# Patient Record
Sex: Male | Born: 1965 | Race: White | Hispanic: No | Marital: Married | Smoking: Former smoker
Health system: Southern US, Community
[De-identification: ages and names within clinical notes are randomized; demographics above are authoritative.]

## PROBLEM LIST (undated history)

## (undated) DIAGNOSIS — C61 Malignant neoplasm of prostate: Principal | ICD-10-CM

## (undated) DIAGNOSIS — R972 Elevated prostate specific antigen [PSA]: Secondary | ICD-10-CM

## (undated) DIAGNOSIS — N390 Urinary tract infection, site not specified: Secondary | ICD-10-CM

## (undated) DIAGNOSIS — I25110 Atherosclerotic heart disease of native coronary artery with unstable angina pectoris: Principal | ICD-10-CM

## (undated) DIAGNOSIS — N401 Enlarged prostate with lower urinary tract symptoms: Secondary | ICD-10-CM

## (undated) DIAGNOSIS — R072 Precordial pain: Secondary | ICD-10-CM

## (undated) DIAGNOSIS — I200 Unstable angina: Secondary | ICD-10-CM

## (undated) DIAGNOSIS — R350 Frequency of micturition: Secondary | ICD-10-CM

## (undated) DIAGNOSIS — N200 Calculus of kidney: Secondary | ICD-10-CM

## (undated) DIAGNOSIS — G4730 Sleep apnea, unspecified: Secondary | ICD-10-CM

## (undated) DIAGNOSIS — E119 Type 2 diabetes mellitus without complications: Secondary | ICD-10-CM

## (undated) DIAGNOSIS — K219 Gastro-esophageal reflux disease without esophagitis: Secondary | ICD-10-CM

## (undated) DIAGNOSIS — E785 Hyperlipidemia, unspecified: Secondary | ICD-10-CM

## (undated) DIAGNOSIS — I10 Essential (primary) hypertension: Secondary | ICD-10-CM

## (undated) DIAGNOSIS — F4310 Post-traumatic stress disorder, unspecified: Secondary | ICD-10-CM

## (undated) HISTORY — DX: Calculus of kidney: N20.0

## (undated) HISTORY — DX: Type 2 diabetes mellitus without complications: E11.9

## (undated) HISTORY — DX: Gastro-esophageal reflux disease without esophagitis: K21.9

## (undated) HISTORY — PX: KNEE SURGERY: SHX244

## (undated) HISTORY — DX: Essential (primary) hypertension: I10

## (undated) HISTORY — PX: APPENDECTOMY: SHX54

## (undated) HISTORY — DX: Hyperlipidemia, unspecified: E78.5

## (undated) HISTORY — DX: Post-traumatic stress disorder, unspecified: F43.10

---

## 2012-05-26 ENCOUNTER — Other Ambulatory Visit: Payer: Self-pay | Admitting: Nurse Practitioner

## 2012-05-26 ENCOUNTER — Telehealth: Payer: Self-pay | Admitting: Nurse Practitioner

## 2012-05-26 MED ORDER — KETOROLAC TROMETHAMINE 10 MG PO TABS: 10.0000 mg | ORAL_TABLET | Freq: Four times a day (QID) | ORAL | Status: DC | PRN

## 2012-05-26 NOTE — Telephone Encounter (Signed)
Let patient know Rx was sent

## 2012-05-26 NOTE — Telephone Encounter (Signed)
Please advise 

## 2012-05-26 NOTE — Telephone Encounter (Signed)
Patient aware.

## 2012-05-26 NOTE — Telephone Encounter (Signed)
Here last week. Has continued lower back pain. Wants rx for toradol oral. [facility]

## 2012-05-30 ENCOUNTER — Telehealth: Payer: Self-pay | Admitting: Nurse Practitioner

## 2012-05-30 NOTE — Telephone Encounter (Signed)
Call Pharmacy was sent in last week

## 2012-05-30 NOTE — Telephone Encounter (Signed)
Patient aware to pick up 

## 2012-05-30 NOTE — Telephone Encounter (Signed)
Travis Silva called and advised the patient that Toradol Oral had been called in to [facility]. The patient checked with the drug store and they still have not received the prescription. Please advise

## 2012-05-30 NOTE — Telephone Encounter (Signed)
Please advise 

## 2012-05-30 NOTE — Telephone Encounter (Signed)
RX was printed. Needs to pick up!

## 2012-12-27 ENCOUNTER — Ambulatory Visit (INDEPENDENT_AMBULATORY_CARE_PROVIDER_SITE_OTHER): Admitting: Family Medicine

## 2012-12-27 ENCOUNTER — Encounter: Payer: Self-pay | Admitting: Family Medicine

## 2012-12-27 ENCOUNTER — Encounter (INDEPENDENT_AMBULATORY_CARE_PROVIDER_SITE_OTHER): Payer: Self-pay

## 2012-12-27 VITALS — BP 148/96 | HR 94 | Temp 98.0°F | Ht 73.0 in | Wt 302.0 lb

## 2012-12-27 DIAGNOSIS — K529 Noninfective gastroenteritis and colitis, unspecified: Secondary | ICD-10-CM

## 2012-12-27 DIAGNOSIS — K5289 Other specified noninfective gastroenteritis and colitis: Secondary | ICD-10-CM

## 2012-12-27 MED ORDER — ONDANSETRON HCL 4 MG PO TABS: 4.0000 mg | ORAL_TABLET | Freq: Three times a day (TID) | ORAL | Status: DC | PRN

## 2012-12-27 NOTE — Progress Notes (Signed)
New Patient History and Physical  Patient name: Travis Silva Medical record number: [id number] Date of birth: [birth year] Age: 47 y.o. Gender: male  Primary Care Provider: Rudi Heap, MD  Chief Complaint: gastroenteritis  History of Present Illness: Onset: 2-3 days  Description: nausea, NBNB diarrhea, generalized malaise  Modifying factors: traveled to [place], [place] > 3 weeks ago  Symptoms:  Incontinence: no  Vomiting: no  Abdominal Pain: minimal  Urgency: yes  Relief with defecation: mild  Weight loss: no  Decreased urine output: minimal  Lightheadedness: no  Recent travel history: yes, as above  Sick contacts: no  Suspicious food or water: no  Change in diet: no  Red Flags:  Fever: no  Bloody stools: no  Recent antibiotics: no  Immuncompromised: no      Past Medical History: There are no active problems to display for this patient.  Past Medical History  Diagnosis Date  . PTSD (post-traumatic stress disorder)     Past Surgical History: Past Surgical History  Procedure Laterality Date  . Appendectomy    . Knee surgery      Social History: History   Social History  . Marital Status: Married    Spouse Name: N/A    Number of Children: N/A  . Years of Education: N/A   Social History Main Topics  . Smoking status: Former Smoker    Types: Cigarettes    Quit date: 06/28/2011  . Smokeless tobacco: Current User  . Alcohol Use: No  . Drug Use: No  . Sexual Activity: None   Other Topics Concern  . None   Social History Narrative  . None    Family History: Family History  Problem Relation Age of Onset  . Diabetes Father     Allergies: Allergies  Allergen Reactions  . Penicillins Anaphylaxis    Current Outpatient Prescriptions  Medication Sig Dispense Refill  . divalproex (DEPAKOTE) 500 MG DR tablet Take 500 mg by mouth 2 (two) times daily.      . QUEtiapine (SEROQUEL) 12.5 mg TABS tablet Take 12.5 mg by mouth at bedtime.      .  Venlafaxine HCl 150 MG TB24 Take 150 mg by mouth daily.      Marland Kitchen ketorolac (TORADOL) 10 MG tablet Take 1 tablet (10 mg total) by mouth every 6 (six) hours as needed for pain.  20 tablet  0  . ondansetron (ZOFRAN) 4 MG tablet Take 1 tablet (4 mg total) by mouth every 8 (eight) hours as needed for nausea.  20 tablet  0   No current facility-administered medications for this visit.   Review Of Systems: 12 point ROS negative except as noted above in HPI.  Physical Exam: Filed Vitals:   12/27/12 1124  BP: 148/96  Pulse: 94  Temp: 98 F (36.7 C)   Physical Exam  Constitutional: He appears well-developed and well-nourished.  HENT:  Head: Normocephalic and atraumatic.  Eyes: Conjunctivae are normal. Pupils are equal, round, and reactive to light.  Neck: Normal range of motion.  Cardiovascular: Normal rate and regular rhythm.  Pulmonary/Chest: Effort normal and breath sounds normal.  Abdominal: Soft.  + hyperacitve bowel sounds  Minimal abd tenderness   Musculoskeletal: Normal range of motion.  Neurological: He is alert.  Skin: Skin is warm.   Labs and Imaging: No results found for this basename: na, k, cl, co2, bun, creatinine, glucose   No results found for this basename: WBC, HGB, HCT, MCV, PLT    No results found.  Assessment and Plan: Gastroenteritis - Plan: ondansetron (ZOFRAN) 4 MG tablet   Suspect viral source of symptoms. No blood clots on exam or history. Patient is known to have travel outside the country greater than 3 weeks ago to [place]. No known exposure to anyone from [place]. Discussed infectious and gastrointestinal reflux in length with patient. Stay hydrated. Follow up as needed.      Doree Albee MD

## 2012-12-27 NOTE — Progress Notes (Deleted)
  Subjective:    Patient ID: Travis Silva, male    DOB: [birth year], 47 y.o.   MRN: [id number]  HPI Diarrhea: Onset: 2-3 days  Description: nausea, NBNB diarrhea, generalized malaise  Modifying factors: traveled to [place], [place] > 3 weeks ago    Symptoms: Incontinence: no Vomiting: no Abdominal Pain: minimal  Urgency: yes Relief with defecation: mild Weight loss: no Decreased urine output: minimal  Lightheadedness: no Recent travel history: yes, as above  Sick contacts: no Suspicious food or water: no Change in diet: no    Red Flags: Fever: no Bloody stools: no Recent antibiotics: no Immuncompromised: no     Review of Systems  All other systems reviewed and are negative.       Objective:   Physical Exam  Constitutional: He appears well-developed and well-nourished.  HENT:  Head: Normocephalic and atraumatic.  Eyes: Conjunctivae are normal. Pupils are equal, round, and reactive to light.  Neck: Normal range of motion.  Cardiovascular: Normal rate and regular rhythm.   Pulmonary/Chest: Effort normal and breath sounds normal.  Abdominal: Soft.  + hyperacitve bowel sounds  Minimal abd tenderness    Musculoskeletal: Normal range of motion.  Neurological: He is alert.  Skin: Skin is warm.          Assessment & Plan:  Gastroenteritis - Plan: ondansetron (ZOFRAN) 4 MG tablet  Suspect viral source of symptoms. No blood clots on exam or history. Patient is known to have travel outside the country greater than 3 weeks ago to [place]. No known exposure to anyone from [place]. Discussed infectious and gastrointestinal reflux in length with patient. Stay hydrated. Follow up as needed.

## 2012-12-27 NOTE — Patient Instructions (Signed)
Viral Gastroenteritis Viral gastroenteritis is also known as stomach flu. This condition affects the stomach and intestinal tract. It can cause sudden diarrhea and vomiting. The illness typically lasts 3 to 8 days. Most people develop an immune response that eventually gets rid of the virus. While this natural response develops, the virus can make you quite ill. CAUSES  Many different viruses can cause gastroenteritis, such as rotavirus or noroviruses. You can catch one of these viruses by consuming contaminated food or water. You may also catch a virus by sharing utensils or other personal items with an infected person or by touching a contaminated surface. SYMPTOMS  The most common symptoms are diarrhea and vomiting. These problems can cause a severe loss of body fluids (dehydration) and a body salt (electrolyte) imbalance. Other symptoms may include:  Fever.  Headache.  Fatigue.  Abdominal pain. DIAGNOSIS  Your caregiver can usually diagnose viral gastroenteritis based on your symptoms and a physical exam. A stool sample may also be taken to test for the presence of viruses or other infections. TREATMENT  This illness typically goes away on its own. Treatments are aimed at rehydration. The most serious cases of viral gastroenteritis involve vomiting so severely that you are not able to keep fluids down. In these cases, fluids must be given through an intravenous line (IV). HOME CARE INSTRUCTIONS   Drink enough fluids to keep your urine clear or pale yellow. Drink small amounts of fluids frequently and increase the amounts as tolerated.  Ask your caregiver for specific rehydration instructions.  Avoid:  Foods high in sugar.  Alcohol.  Carbonated drinks.  Tobacco.  Juice.  Caffeine drinks.  Extremely hot or cold fluids.  Fatty, greasy foods.  Too much intake of anything at one time.  Dairy products until 24 to 48 hours after diarrhea stops.  You may consume probiotics.  Probiotics are active cultures of beneficial bacteria. They may lessen the amount and number of diarrheal stools in adults. Probiotics can be found in yogurt with active cultures and in supplements.  Wash your hands well to avoid spreading the virus.  Only take over-the-counter or prescription medicines for pain, discomfort, or fever as directed by your caregiver. Do not give aspirin to children. Antidiarrheal medicines are not recommended.  Ask your caregiver if you should continue to take your regular prescribed and over-the-counter medicines.  Keep all follow-up appointments as directed by your caregiver. SEEK IMMEDIATE MEDICAL CARE IF:   You are unable to keep fluids down.  You do not urinate at least once every 6 to 8 hours.  You develop shortness of breath.  You notice blood in your stool or vomit. This may look like coffee grounds.  You have abdominal pain that increases or is concentrated in one small area (localized).  You have persistent vomiting or diarrhea.  You have a fever.  The patient is a child younger than 3 months, and he or she has a fever.  The patient is a child older than 3 months, and he or she has a fever and persistent symptoms.  The patient is a child older than 3 months, and he or she has a fever and symptoms suddenly get worse.  The patient is a baby, and he or she has no tears when crying. MAKE SURE YOU:   Understand these instructions.  Will watch your condition.  Will get help right away if you are not doing well or get worse. Document Released: 02/23/2005 Document Revised: 05/18/2011 Document Reviewed: 12/10/2010   ExitCare Patient Information 2014 [employer].  

## 2013-01-03 ENCOUNTER — Ambulatory Visit (INDEPENDENT_AMBULATORY_CARE_PROVIDER_SITE_OTHER)

## 2013-01-03 DIAGNOSIS — Z23 Encounter for immunization: Secondary | ICD-10-CM

## 2013-10-05 ENCOUNTER — Ambulatory Visit (INDEPENDENT_AMBULATORY_CARE_PROVIDER_SITE_OTHER): Admitting: Physician Assistant

## 2013-10-05 ENCOUNTER — Encounter: Payer: Self-pay | Admitting: Physician Assistant

## 2013-10-05 VITALS — BP 128/87 | HR 94 | Temp 99.1°F | Ht 75.0 in | Wt 303.0 lb

## 2013-10-05 DIAGNOSIS — N410 Acute prostatitis: Secondary | ICD-10-CM

## 2013-10-05 DIAGNOSIS — R350 Frequency of micturition: Secondary | ICD-10-CM

## 2013-10-05 LAB — POCT URINALYSIS DIPSTICK
Bilirubin, UA: NEGATIVE
Blood, UA: NEGATIVE
GLUCOSE UA: NEGATIVE
Ketones, UA: NEGATIVE
Leukocytes, UA: NEGATIVE
NITRITE UA: NEGATIVE
Spec Grav, UA: 1.015
UROBILINOGEN UA: 0.2
pH, UA: 7

## 2013-10-05 LAB — POCT UA - MICROSCOPIC ONLY
CASTS, UR, LPF, POC: NEGATIVE
Crystals, Ur, HPF, POC: NEGATIVE
Mucus, UA: NEGATIVE
YEAST UA: NEGATIVE

## 2013-10-05 MED ORDER — SULFAMETHOXAZOLE-TMP DS 800-160 MG PO TABS: 1.0000 | ORAL_TABLET | Freq: Two times a day (BID) | ORAL | Status: DC

## 2013-10-05 NOTE — Patient Instructions (Signed)

## 2013-10-05 NOTE — Progress Notes (Signed)
Subjective:     Patient ID: Travis Silva, male   DOB: [birth year], 48 y.o.   MRN: [id number]  HPI Pt with hx a 1 month hx of increase in urinary freq He has a hx of NIDDM but states BS have been doing well Nocturia x 1 but takes a sleep med Denies any dysuria  Review of Systems  Gastrointestinal: Negative.   Genitourinary: Positive for urgency, frequency and penile pain. Negative for dysuria, hematuria, flank pain, decreased urine volume, discharge, enuresis and testicular pain.       Objective:   Physical Exam  Nursing note and vitals reviewed. Constitutional: He appears well-developed and well-nourished.  Abdominal: Soft. Bowel sounds are normal. He exhibits no distension and no mass. There is no tenderness.  UA-see labs     Assessment:     Prostatitis    Plan:     Given hx will treat with Bactrim DS x 2 weeks Nl course reviewed Fluids [facility] if sx continue

## 2013-10-20 ENCOUNTER — Telehealth: Payer: Self-pay | Admitting: Family Medicine

## 2013-10-20 MED ORDER — SULFAMETHOXAZOLE-TMP DS 800-160 MG PO TABS: 1.0000 | ORAL_TABLET | Freq: Two times a day (BID) | ORAL | Status: DC

## 2013-10-20 NOTE — Telephone Encounter (Signed)
Can take up to a month to resolve

## 2013-10-20 NOTE — Telephone Encounter (Signed)
reill ent to pharmacy

## 2013-10-20 NOTE — Telephone Encounter (Signed)
Bill told him we would call him in the septra for another 2 weeks if it was no better?

## 2013-10-20 NOTE — Addendum Note (Signed)
Addended by: Bennie PieriniMARTIN, MARY-MARGARET on: 10/20/2013 10:02 AM   Modules accepted: Orders

## 2013-10-20 NOTE — Telephone Encounter (Signed)
[facility] gave him septra for prostoitis but he is not better bill told him to call back

## 2013-11-20 ENCOUNTER — Telehealth: Payer: Self-pay | Admitting: Family Medicine

## 2013-11-20 NOTE — Telephone Encounter (Signed)
appt scheduled

## 2013-11-22 ENCOUNTER — Ambulatory Visit (INDEPENDENT_AMBULATORY_CARE_PROVIDER_SITE_OTHER): Admitting: Family Medicine

## 2013-11-22 ENCOUNTER — Encounter: Payer: Self-pay | Admitting: Family Medicine

## 2013-11-22 VITALS — BP 139/97 | HR 107 | Temp 97.6°F | Ht 75.0 in | Wt 308.2 lb

## 2013-11-22 DIAGNOSIS — R350 Frequency of micturition: Secondary | ICD-10-CM

## 2013-11-22 LAB — POCT URINALYSIS DIPSTICK
Bilirubin, UA: NEGATIVE
Glucose, UA: NEGATIVE
Ketones, UA: NEGATIVE
Leukocytes, UA: NEGATIVE
Nitrite, UA: NEGATIVE
Protein, UA: NEGATIVE
RBC UA: NEGATIVE
Spec Grav, UA: 1.015
UROBILINOGEN UA: NEGATIVE
pH, UA: 6

## 2013-11-22 LAB — POCT UA - MICROSCOPIC ONLY
BACTERIA, U MICROSCOPIC: NEGATIVE
CASTS, UR, LPF, POC: NEGATIVE
Crystals, Ur, HPF, POC: NEGATIVE
EPITHELIAL CELLS, URINE PER MICROSCOPY: NEGATIVE
MUCUS UA: NEGATIVE
RBC, urine, microscopic: NEGATIVE
WBC, Ur, HPF, POC: NEGATIVE
Yeast, UA: NEGATIVE

## 2013-11-22 MED ORDER — CIPROFLOXACIN HCL 500 MG PO TABS: 500.0000 mg | ORAL_TABLET | Freq: Two times a day (BID) | ORAL | Status: DC

## 2013-11-22 NOTE — Patient Instructions (Signed)

## 2013-11-22 NOTE — Progress Notes (Signed)
   Subjective:    Patient ID: Travis Silva, male    DOB: January 15, 1966, 48 y.o.   MRN: [id number]  HPI  48 year old gentleman here with urinary frequency urine he was treated for prostate infection back in July with 2 courses of Septra or Bactrim. Of note, he also has new onset diabetes and is treated with with metformin. His A1c has decreased from 13 to 6 in round numbers. He denies dysuria. He is also on several medicines for PTSD including venlafaxine, latuda, and depakote.Marland Kitchen although these have helped minimally they also have side effects that affect his sexual function.    Review of Systems  Constitutional: Negative.   HENT: Negative.   Eyes: Negative.   Respiratory: Negative.  Negative for shortness of breath.   Cardiovascular: Negative.  Negative for chest pain and leg swelling.  Gastrointestinal: Negative.   Endocrine: Positive for polyuria.  Genitourinary: Negative.   Musculoskeletal: Negative.   Skin: Negative.   Neurological: Negative.   Psychiatric/Behavioral: Negative.   All other systems reviewed and are negative.      Objective:   Physical Exam  Genitourinary:  Prostate  boggy and tender but not especially enlarged. I think these findings are consistent with prostatitis    BP 139/97  Pulse 107  Temp(Src) 97.6 F (36.4 C) (Oral)  Ht  (1.905 m)  Wt 308 lb 3.2 oz (139.799 kg)  BMI 38.52 kg/m2      Assessment & Plan:  1. Urinary frequency Probable prostatitis.  Treat with Cipro x 2 weeks with a refill if sx persist - POCT UA - Microscopic Only - POCT urinalysis dipstick  Frederica Kuster MD

## 2013-12-28 ENCOUNTER — Ambulatory Visit

## 2014-02-19 ENCOUNTER — Telehealth: Payer: Self-pay | Admitting: Nurse Practitioner

## 2014-02-19 NOTE — Telephone Encounter (Signed)
Stp, he requested appt with MMM as he has seen dr Hyacinth Meekermiller and was put on Cipro x 2 for acute prostatitis, pt is having urinary retention and ED. Pt given appt with MMM 12/17 @ 11:15.

## 2014-02-22 ENCOUNTER — Encounter: Payer: Self-pay | Admitting: Nurse Practitioner

## 2014-02-22 ENCOUNTER — Ambulatory Visit (INDEPENDENT_AMBULATORY_CARE_PROVIDER_SITE_OTHER): Admitting: *Deleted

## 2014-02-22 ENCOUNTER — Encounter (INDEPENDENT_AMBULATORY_CARE_PROVIDER_SITE_OTHER): Payer: Self-pay

## 2014-02-22 ENCOUNTER — Ambulatory Visit (INDEPENDENT_AMBULATORY_CARE_PROVIDER_SITE_OTHER): Admitting: Nurse Practitioner

## 2014-02-22 VITALS — BP 139/87 | HR 100 | Temp 98.0°F | Ht 75.0 in | Wt 322.2 lb

## 2014-02-22 DIAGNOSIS — N400 Enlarged prostate without lower urinary tract symptoms: Secondary | ICD-10-CM

## 2014-02-22 DIAGNOSIS — R3915 Urgency of urination: Secondary | ICD-10-CM

## 2014-02-22 DIAGNOSIS — Z23 Encounter for immunization: Secondary | ICD-10-CM

## 2014-02-22 DIAGNOSIS — N529 Male erectile dysfunction, unspecified: Secondary | ICD-10-CM

## 2014-02-22 LAB — POCT UA - MICROSCOPIC ONLY
CRYSTALS, UR, HPF, POC: NEGATIVE
Casts, Ur, LPF, POC: NEGATIVE
MUCUS UA: NEGATIVE
RBC, urine, microscopic: NEGATIVE
WBC, Ur, HPF, POC: NEGATIVE
YEAST UA: NEGATIVE

## 2014-02-22 LAB — POCT URINALYSIS DIPSTICK
Bilirubin, UA: NEGATIVE
KETONES UA: NEGATIVE
Leukocytes, UA: NEGATIVE
NITRITE UA: NEGATIVE
PH UA: 6
PROTEIN UA: NEGATIVE
Spec Grav, UA: 1.01
Urobilinogen, UA: NEGATIVE

## 2014-02-22 MED ORDER — TADALAFIL 5 MG PO TABS: ORAL_TABLET | ORAL | Status: DC

## 2014-02-22 NOTE — Progress Notes (Signed)
   Subjective:    Patient ID: Travis Silva, male    DOB: [birth year], 48 y.o.   MRN: [id number]  HPI Patient was seen by B. Oxford in July and was dx with prostatits. Was treated with bactrim for 2 weeks. He ten came in and saw Dr. Hyacinth MeekerMiller in September with c/o urinary urgency and ED. He was told that it may be coming from his medicine- [facility] and [facility] med[facility] and they were all negative. He also developed diabetes and Hgba1c is under control. [facility] which did not work for his ED and he also still has trouble holding his urine when he needs to go.  He also says that semen with eject out after he has urinated.They also cut his depakote and venlafaxine in 1/2 and is still having problems.    Review of Systems  Constitutional: Negative.   HENT: Negative.   Respiratory: Negative.   Cardiovascular: Negative.   Genitourinary: Positive for urgency.  Neurological: Negative.   Psychiatric/Behavioral: Negative.   All other systems reviewed and are negative.      Objective:   Physical Exam  Constitutional: He is oriented to person, place, and time. He appears well-developed and well-nourished.  Cardiovascular: Normal rate, regular rhythm and normal heart sounds.   Pulmonary/Chest: Effort normal and breath sounds normal.  Abdominal: Soft. Bowel sounds are normal.  Genitourinary: Penis normal.  Neurological: He is alert and oriented to person, place, and time.  Skin: Skin is warm and dry.  Psychiatric: He has a normal mood and affect. His behavior is normal. Judgment and thought content normal.   BP 139/87 mmHg  Pulse 100  Temp(Src) 98 F (36.7 C) (Oral)  Ht 6\' 3"  (1.905 m)  Wt 322 lb 3.2 oz (146.149 kg)  BMI 40.27 kg/m2       Assessment & Plan:   1. BPH (benign prostatic hyperplasia)   2. Erectile dysfunction, unspecified erectile dysfunction type

## 2014-02-22 NOTE — Patient Instructions (Signed)
Benign Prostatic Hyperplasia An enlarged prostate (benign prostatic hyperplasia) is common in older men. You may experience the following:  Weak urine stream.  Dribbling.  Feeling like the bladder has not emptied completely.  Difficulty starting urination.  Getting up frequently at night to urinate.  Urinating more frequently during the day. HOME CARE INSTRUCTIONS  Monitor your prostatic hyperplasia for any changes. The following actions may help to alleviate any discomfort you are experiencing:  Give yourself time when you urinate.  Stay away from alcohol.  Avoid beverages containing caffeine, such as coffee, tea, and colas, because they can make the problem worse.  Avoid decongestants, antihistamines, and some prescription medicines that can make the problem worse.  Follow up with your health care provider for further treatment as recommended. SEEK MEDICAL CARE IF:  You are experiencing progressive difficulty voiding.  Your urine stream is progressively getting narrower.  You are awaking from sleep with the urge to void more frequently.  You are constantly feeling the need to void.  You experience loss of urine, especially in small amounts. SEEK IMMEDIATE MEDICAL CARE IF:   You develop increased pain with urination or are unable to urinate.  You develop severe abdominal pain, vomiting, a high fever, or fainting.  You develop back pain or blood in your urine. MAKE SURE YOU:   Understand these instructions.  Will watch your condition.  Will get help right away if you are not doing well or get worse. Document Released: 02/23/2005 Document Revised: 10/26/2012 Document Reviewed: 07/26/2012 ExitCare Patient Information 2015 [employer]. This information is not intended to replace advice given to you by your health care provider. Make sure you discuss any questions you have with your health care provider.  

## 2014-04-11 ENCOUNTER — Telehealth: Payer: Self-pay | Admitting: Nurse Practitioner

## 2014-04-11 DIAGNOSIS — N400 Enlarged prostate without lower urinary tract symptoms: Secondary | ICD-10-CM

## 2014-04-11 MED ORDER — TADALAFIL 5 MG PO TABS: ORAL_TABLET | ORAL | Status: DC

## 2014-04-11 NOTE — Telephone Encounter (Signed)
cialis rx sent to pharmacy

## 2014-04-11 NOTE — Telephone Encounter (Signed)
Patient last seen 02-22-14. Please advise

## 2014-05-09 ENCOUNTER — Encounter: Payer: Self-pay | Admitting: Family Medicine

## 2014-05-09 ENCOUNTER — Ambulatory Visit (INDEPENDENT_AMBULATORY_CARE_PROVIDER_SITE_OTHER): Admitting: Family Medicine

## 2014-05-09 VITALS — BP 128/76 | HR 91 | Temp 98.2°F | Ht 75.0 in | Wt 318.0 lb

## 2014-05-09 DIAGNOSIS — J202 Acute bronchitis due to streptococcus: Secondary | ICD-10-CM | POA: Diagnosis not present

## 2014-05-09 MED ORDER — LEVOFLOXACIN 500 MG PO TABS: 500.0000 mg | ORAL_TABLET | Freq: Every day | ORAL | Status: DC

## 2014-05-09 MED ORDER — BETAMETHASONE SOD PHOS & ACET 6 (3-3) MG/ML IJ SUSP
6.0000 mg | Freq: Once | INTRAMUSCULAR | Status: AC
  Administered 2014-05-09: 6 mg via INTRAMUSCULAR

## 2014-05-09 MED ORDER — HYDROCODONE-HOMATROPINE 5-1.5 MG/5ML PO SYRP: 5.0000 mL | ORAL_SOLUTION | ORAL | Status: DC | PRN

## 2014-05-09 NOTE — Progress Notes (Signed)
Subjective:  Patient ID: Travis Silva, male    DOB: 1965-12-07  Age: 49 y.o. MRN: [id number]  CC: Cough   HPI Travis JensenKenneth Steffensmeier presents for 1 week of increasing cough. It has not been productive but he bring something up into his throat that he feels like needs to come out but it just won't. He's had some subjective fever and sweats but no chills. He has not checked his own temperature. Mild upper respiratory congestion without earache rhinorrhea or sore throat   History Iantha FallenKenneth has a past medical history of PTSD (post-traumatic stress disorder); Diabetes mellitus without complication; Hyperlipidemia; Hypertension; and GERD (gastroesophageal reflux disease).   He has past surgical history that includes Appendectomy and Knee surgery.   His family history includes Diabetes in his father.He reports that he quit smoking about 2 years ago. His smoking use included Cigarettes. He uses smokeless tobacco. He reports that he does not drink alcohol or use illicit drugs.  Current Outpatient Prescriptions on File Prior to Visit  Medication Sig Dispense Refill  . atorvastatin (LIPITOR) 20 MG tablet Take 20 mg by mouth at bedtime.    . divalproex (DEPAKOTE) 500 MG DR tablet Take 500 mg by mouth 2 (two) times daily.    Marland Kitchen. lurasidone (LATUDA) 80 MG TABS tablet Take 80 mg by mouth daily with breakfast.    . metFORMIN (GLUCOPHAGE) 500 MG tablet Take by mouth 2 (two) times daily with a meal.    . tadalafil (CIALIS) 5 MG tablet 1 po qd for BPH 30 tablet 2  . Venlafaxine HCl 150 MG TB24 Take 150 mg by mouth daily.    . Vitamin D, Ergocalciferol, (DRISDOL) 50000 UNITS CAPS capsule Take 50,000 Units by mouth every 7 (seven) days.     No current facility-administered medications on file prior to visit.    ROS Review of Systems  Constitutional: Negative for fever, chills, activity change and appetite change.  HENT: Positive for congestion and postnasal drip. Negative for ear discharge, ear pain,  hearing loss, nosebleeds, rhinorrhea, sinus pressure, sneezing, sore throat and trouble swallowing.   Respiratory: Negative for chest tightness and shortness of breath.   Cardiovascular: Negative for chest pain and palpitations.  Skin: Negative for rash.    Objective:  BP 128/76 mmHg  Pulse 91  Temp(Src) 98.2 F (36.8 C) (Oral)  Ht 6\' 3"  (1.905 m)  Wt 318 lb (144.244 kg)  BMI 39.75 kg/m2  BP Readings from Last 3 Encounters:  05/09/14 128/76  02/22/14 139/87  11/22/13 139/97    Wt Readings from Last 3 Encounters:  05/09/14 318 lb (144.244 kg)  02/22/14 322 lb 3.2 oz (146.149 kg)  11/22/13 308 lb 3.2 oz (139.799 kg)     Physical Exam  Constitutional: He is oriented to person, place, and time. He appears well-developed and well-nourished. No distress.  HENT:  Head: Normocephalic and atraumatic.  Right Ear: External ear normal.  Left Ear: External ear normal.  Nose: Nose normal.  Mouth/Throat: Oropharynx is clear and moist.  Eyes: Conjunctivae and EOM are normal. Pupils are equal, round, and reactive to light.  Neck: Normal range of motion. Neck supple. No thyromegaly present.  Cardiovascular: Normal rate, regular rhythm and normal heart sounds.   No murmur heard. Pulmonary/Chest: Effort normal. No respiratory distress. He has no wheezes. He has no rales.  Some bronchovesicular care or to the breath sounds  Abdominal: Soft. Bowel sounds are normal. He exhibits no distension. There is no tenderness.  Lymphadenopathy:  He has no cervical adenopathy.  Neurological: He is alert and oriented to person, place, and time. He has normal reflexes.  Skin: Skin is warm and dry.  Psychiatric: He has a normal mood and affect. His behavior is normal. Judgment and thought content normal.    No results found for: HGBA1C  No results found for: WBC, HGB, HCT, PLT, GLUCOSE, CHOL, TRIG, HDL, LDLDIRECT, LDLCALC, ALT, AST, NA, K, CL, CREATININE, BUN, CO2, TSH, PSA, INR, GLUF, HGBA1C,  MICROALBUR  Patient was never admitted.  Assessment & Plan:   Collin was seen today for cough.  Diagnoses and all orders for this visit:  Acute bronchitis due to Streptococcus Orders: -     betamethasone acetate-betamethasone sodium phosphate (CELESTONE) injection 6 mg; Inject 1 mL (6 mg total) into the muscle once. -     HYDROcodone-homatropine (HYCODAN) 5-1.5 MG/5ML syrup 5 mL; Take 5 mLs by mouth every 4 (four) hours as needed for cough.  Other orders -     levofloxacin (LEVAQUIN) 500 MG tablet; Take 1 tablet (500 mg total) by mouth daily. -     HYDROcodone-homatropine (HYCODAN) 5-1.5 MG/5ML syrup; Take 5 mLs by mouth every 4 (four) hours as needed for cough.   I have discontinued Mr. Dimaano GABAPENTIN PO. I am also having him start on levofloxacin and HYDROcodone-homatropine. Additionally, I am having him maintain his divalproex, Venlafaxine HCl, metFORMIN, Vitamin D (Ergocalciferol), atorvastatin, lurasidone, and tadalafil. We administered betamethasone acetate-betamethasone sodium phosphate. We will continue to administer HYDROcodone-homatropine.  Meds ordered this encounter  Medications  . betamethasone acetate-betamethasone sodium phosphate (CELESTONE) injection 6 mg    Sig:   . HYDROcodone-homatropine (HYCODAN) 5-1.5 MG/5ML syrup 5 mL    Sig:   . levofloxacin (LEVAQUIN) 500 MG tablet    Sig: Take 1 tablet (500 mg total) by mouth daily.    Dispense:  7 tablet    Refill:  0  . HYDROcodone-homatropine (HYCODAN) 5-1.5 MG/5ML syrup    Sig: Take 5 mLs by mouth every 4 (four) hours as needed for cough.    Dispense:  120 mL    Refill:  0     Follow-up: Return if symptoms worsen or fail to improve.  Mechele Claude, M.D.

## 2014-05-10 ENCOUNTER — Telehealth: Payer: Self-pay | Admitting: *Deleted

## 2014-05-10 MED ORDER — ALBUTEROL SULFATE HFA 108 (90 BASE) MCG/ACT IN AERS: 2.0000 | INHALATION_SPRAY | Freq: Four times a day (QID) | RESPIRATORY_TRACT | Status: DC | PRN

## 2014-05-10 NOTE — Telephone Encounter (Signed)
Spoke with pt to inform RX for Albuterol Inhaler sent into pharmacy

## 2014-05-17 ENCOUNTER — Other Ambulatory Visit: Payer: Self-pay | Admitting: Nurse Practitioner

## 2014-05-18 NOTE — Telephone Encounter (Signed)
Zyrtec  

## 2014-05-18 NOTE — Telephone Encounter (Signed)
Available over-the-counter as zytec for multiple store brands. No prescription needed.

## 2014-05-18 NOTE — Telephone Encounter (Signed)
I have never heard of certex. Please clarify with pt.   Okay to send in flonase 2 spray, each nostril, daily.  1 Bottle with refill X 1 year

## 2014-05-21 ENCOUNTER — Telehealth: Payer: Self-pay | Admitting: Nurse Practitioner

## 2014-05-21 MED ORDER — FLUTICASONE PROPIONATE 50 MCG/ACT NA SUSP: 2.0000 | Freq: Every day | NASAL | Status: DC

## 2014-05-21 MED ORDER — CETIRIZINE HCL 10 MG PO TABS: 10.0000 mg | ORAL_TABLET | Freq: Every day | ORAL | Status: DC

## 2014-05-21 NOTE — Telephone Encounter (Signed)
Patient aware and rx sent into pharmacy 

## 2014-05-21 NOTE — Telephone Encounter (Signed)
Encounter handled in another telephone encounter

## 2014-07-27 ENCOUNTER — Ambulatory Visit (INDEPENDENT_AMBULATORY_CARE_PROVIDER_SITE_OTHER): Admitting: Nurse Practitioner

## 2014-07-27 ENCOUNTER — Ambulatory Visit (INDEPENDENT_AMBULATORY_CARE_PROVIDER_SITE_OTHER)

## 2014-07-27 ENCOUNTER — Encounter: Payer: Self-pay | Admitting: Nurse Practitioner

## 2014-07-27 VITALS — BP 120/83 | HR 91 | Temp 98.7°F | Ht 75.0 in | Wt 318.0 lb

## 2014-07-27 DIAGNOSIS — R05 Cough: Secondary | ICD-10-CM

## 2014-07-27 DIAGNOSIS — I10 Essential (primary) hypertension: Secondary | ICD-10-CM | POA: Diagnosis not present

## 2014-07-27 DIAGNOSIS — R053 Chronic cough: Secondary | ICD-10-CM

## 2014-07-27 MED ORDER — LOSARTAN POTASSIUM 50 MG PO TABS: 50.0000 mg | ORAL_TABLET | Freq: Every day | ORAL | Status: DC

## 2014-07-27 NOTE — Progress Notes (Signed)
   Subjective:    Patient ID: Travis Silva, male    DOB: May 31, 1965, 49 y.o.   MRN: [id number]  HPI Patient in today c/o chronic cough- dry cough- use to be a smoker but quit 3 years ago- cough is daily and has been here several times with complaint- has had prednisone which did not help. He is on an lisinopril.    Review of Systems  Constitutional: Negative.   HENT: Negative.   Respiratory: Positive for cough. Negative for shortness of breath.   Cardiovascular: Negative for chest pain.  Gastrointestinal: Negative.   Genitourinary: Negative.   Neurological: Negative.   Psychiatric/Behavioral: Negative.   All other systems reviewed and are negative.      Objective:   Physical Exam  Constitutional: He is oriented to person, place, and time. He appears well-developed and well-nourished.  Cardiovascular: Normal rate, regular rhythm and normal heart sounds.   Pulmonary/Chest: Effort normal and breath sounds normal. No respiratory distress. He has no wheezes.  Dry cough  Neurological: He is alert and oriented to person, place, and time.  Skin: Skin is warm and dry.  Psychiatric: He has a normal mood and affect. His behavior is normal. Judgment and thought content normal.    BP 120/83 mmHg  Pulse 91  Temp(Src) 98.7 F (37.1 C) (Oral)  Ht 6\' 3"  (1.905 m)  Wt 318 lb (144.244 kg)  BMI 39.75 kg/m2  Chest x ray- chronic bronchitic changes-Preliminary reading by Paulene FloorMary Dorethea Strubel, FNP  [facility]      Assessment & Plan:  1. Chronic cough Stop lisinopril - DG Chest 2 View; Future  2. Essential hypertension Keep check of blood pressure - losartan (COZAAR) 50 MG tablet; Take 1 tablet (50 mg total) by mouth daily.  Dispense: 90 tablet; Refill: 1   Mary-Margaret Daphine DeutscherMartin, FNP

## 2014-07-27 NOTE — Patient Instructions (Signed)

## 2014-08-23 ENCOUNTER — Emergency Department (HOSPITAL_COMMUNITY)
Admission: EM | Admit: 2014-08-23 | Discharge: 2014-08-23 | Disposition: A | Discharge status: Home / Self Care | Attending: Emergency Medicine | Admitting: Emergency Medicine

## 2014-08-23 ENCOUNTER — Encounter (HOSPITAL_COMMUNITY): Payer: Self-pay | Admitting: Emergency Medicine

## 2014-08-23 DIAGNOSIS — Z7951 Long term (current) use of inhaled steroids: Secondary | ICD-10-CM | POA: Diagnosis not present

## 2014-08-23 DIAGNOSIS — Z880 Allergy status to penicillin: Secondary | ICD-10-CM | POA: Diagnosis not present

## 2014-08-23 DIAGNOSIS — K219 Gastro-esophageal reflux disease without esophagitis: Secondary | ICD-10-CM | POA: Diagnosis not present

## 2014-08-23 DIAGNOSIS — E785 Hyperlipidemia, unspecified: Secondary | ICD-10-CM | POA: Insufficient documentation

## 2014-08-23 DIAGNOSIS — R109 Unspecified abdominal pain: Secondary | ICD-10-CM | POA: Insufficient documentation

## 2014-08-23 DIAGNOSIS — Z9049 Acquired absence of other specified parts of digestive tract: Secondary | ICD-10-CM | POA: Diagnosis not present

## 2014-08-23 DIAGNOSIS — I10 Essential (primary) hypertension: Secondary | ICD-10-CM | POA: Insufficient documentation

## 2014-08-23 DIAGNOSIS — Z87891 Personal history of nicotine dependence: Secondary | ICD-10-CM | POA: Diagnosis not present

## 2014-08-23 DIAGNOSIS — Z79899 Other long term (current) drug therapy: Secondary | ICD-10-CM | POA: Diagnosis not present

## 2014-08-23 DIAGNOSIS — Z87442 Personal history of urinary calculi: Secondary | ICD-10-CM | POA: Insufficient documentation

## 2014-08-23 DIAGNOSIS — E119 Type 2 diabetes mellitus without complications: Secondary | ICD-10-CM | POA: Insufficient documentation

## 2014-08-23 DIAGNOSIS — F4310 Post-traumatic stress disorder, unspecified: Secondary | ICD-10-CM | POA: Diagnosis not present

## 2014-08-23 LAB — CBC WITH DIFFERENTIAL/PLATELET
Basophils Absolute: 0.1 10*3/uL (ref 0.0–0.1)
Basophils Relative: 1 % (ref 0–1)
Eosinophils Absolute: 0.2 10*3/uL (ref 0.0–0.7)
Eosinophils Relative: 2 % (ref 0–5)
HEMATOCRIT: 41.7 % (ref 39.0–52.0)
Hemoglobin: 13.7 g/dL (ref 13.0–17.0)
LYMPHS PCT: 39 % (ref 12–46)
Lymphs Abs: 3.3 10*3/uL (ref 0.7–4.0)
MCH: 29.4 pg (ref 26.0–34.0)
MCHC: 32.9 g/dL (ref 30.0–36.0)
MCV: 89.5 fL (ref 78.0–100.0)
MONO ABS: 0.7 10*3/uL (ref 0.1–1.0)
MONOS PCT: 8 % (ref 3–12)
Neutro Abs: 4.1 10*3/uL (ref 1.7–7.7)
Neutrophils Relative %: 50 % (ref 43–77)
Platelets: 264 10*3/uL (ref 150–400)
RBC: 4.66 MIL/uL (ref 4.22–5.81)
RDW: 12.6 % (ref 11.5–15.5)
WBC: 8.3 10*3/uL (ref 4.0–10.5)

## 2014-08-23 LAB — COMPREHENSIVE METABOLIC PANEL
ALT: 45 U/L (ref 17–63)
AST: 31 U/L (ref 15–41)
Albumin: 4.1 g/dL (ref 3.5–5.0)
Alkaline Phosphatase: 48 U/L (ref 38–126)
Anion gap: 7 (ref 5–15)
BUN: 17 mg/dL (ref 6–20)
CALCIUM: 8.8 mg/dL — AB (ref 8.9–10.3)
CO2: 25 mmol/L (ref 22–32)
Chloride: 107 mmol/L (ref 101–111)
Creatinine, Ser: 0.94 mg/dL (ref 0.61–1.24)
GFR calc non Af Amer: >60 mL/min (ref >60)
GLUCOSE: 137 mg/dL — AB (ref 65–99)
Potassium: 4.1 mmol/L (ref 3.5–5.1)
SODIUM: 139 mmol/L (ref 135–145)
TOTAL PROTEIN: 6.8 g/dL (ref 6.5–8.1)
Total Bilirubin: 0.6 mg/dL (ref 0.3–1.2)

## 2014-08-23 LAB — URINALYSIS, ROUTINE W REFLEX MICROSCOPIC
BILIRUBIN URINE: NEGATIVE
Glucose, UA: NEGATIVE mg/dL
Hgb urine dipstick: NEGATIVE
Leukocytes, UA: NEGATIVE
Nitrite: NEGATIVE
Protein, ur: NEGATIVE mg/dL
SPECIFIC GRAVITY, URINE: 1.02 (ref 1.005–1.030)
Urobilinogen, UA: 0.2 mg/dL (ref 0.0–1.0)
pH: 6.5 (ref 5.0–8.0)

## 2014-08-23 MED ORDER — HYDROMORPHONE HCL 1 MG/ML IJ SOLN
1.0000 mg | Freq: Once | INTRAMUSCULAR | Status: AC
  Administered 2014-08-23: 1 mg via INTRAVENOUS
  Filled 2014-08-23: qty 1

## 2014-08-23 MED ORDER — HYDROMORPHONE HCL 4 MG PO TABS: 4.0000 mg | ORAL_TABLET | Freq: Four times a day (QID) | ORAL | Status: DC | PRN

## 2014-08-23 MED ORDER — ONDANSETRON HCL 4 MG/2ML IJ SOLN
4.0000 mg | Freq: Once | INTRAMUSCULAR | Status: AC
  Administered 2014-08-23: 4 mg via INTRAVENOUS
  Filled 2014-08-23: qty 2

## 2014-08-23 NOTE — Discharge Instructions (Signed)
Follow up with your md next week. °

## 2014-08-23 NOTE — ED Notes (Signed)
Pt. Reports right flank pain this morning. Pt reports history of kidney stones. Pt. Reports taking a Percocet at 1700 with no relief.

## 2014-08-23 NOTE — ED Provider Notes (Signed)
CSN: [id number]     Arrival date & time 08/23/14  1946 History  This chart was scribed for Bethann Berkshire, MD by Tanda Rockers, ED Scribe. This patient was seen in room [facility]14[facility] and the patient's care was started at 8:09 PM.    Chief Complaint  Patient presents with  . Flank Pain   Patient is a 49 y.o. male presenting with flank pain. The history is provided by the patient. No language interpreter was used.  Flank Pain This is a new problem. The current episode started 12 to 24 hours ago. The problem occurs constantly. The problem has not changed since onset.Pertinent negatives include no chest pain, no abdominal pain and no headaches. The symptoms are relieved by narcotics. Treatments tried: Percocet.     HPI Comments: Travis Silva is a 49 y.o. male who presents to the Emergency Department complaining of sudden onset right flank pain that began this morning around 7 AM (approximately 13 hours ago). Pt was seen at [facility] earlier today and had CT A/P with no acute findings. Pt was given Morphine in the ED which relieved symptoms. He was discharged and given prescription for Percocet. Pt reports taking Percocet at around 5 PM today (3 hours ago) with no relief. He reports hx of kidney stones in the past and states that the pain feels similar. Denies fever, chills, nausea, vomiting, or any other symptoms.    Past Medical History  Diagnosis Date  . PTSD (post-traumatic stress disorder)   . Diabetes mellitus without complication   . Hyperlipidemia   . Hypertension   . GERD (gastroesophageal reflux disease)    Past Surgical History  Procedure Laterality Date  . Appendectomy    . Knee surgery     Family History  Problem Relation Age of Onset  . Diabetes Father    History  Substance Use Topics  . Smoking status: Former Smoker    Types: Cigarettes    Quit date: 06/28/2011  . Smokeless tobacco: Current User  . Alcohol Use: No    Review of Systems  Constitutional: Negative  for appetite change and fatigue.  HENT: Negative for congestion, ear discharge and sinus pressure.   Eyes: Negative for discharge.  Respiratory: Negative for cough.   Cardiovascular: Negative for chest pain.  Gastrointestinal: Negative for abdominal pain and diarrhea.  Genitourinary: Positive for flank pain (Right). Negative for frequency and hematuria.  Musculoskeletal: Negative for back pain.  Skin: Negative for rash.  Neurological: Negative for seizures and headaches.  Psychiatric/Behavioral: Negative for hallucinations.      Allergies  Penicillins and Ace inhibitors  Home Medications   Prior to Admission medications   Medication Sig Start Date End Date Taking? Authorizing Provider  albuterol (PROVENTIL HFA;VENTOLIN HFA) 108 (90 BASE) MCG/ACT inhaler Inhale 2 puffs into the lungs every 6 (six) hours as needed for wheezing or shortness of breath. 05/10/14   Mechele Claude, MD  aspirin 81 MG tablet Take 81 mg by mouth daily.    Historical Provider, MD  atorvastatin (LIPITOR) 80 MG tablet Take 80 mg by mouth daily.    Historical Provider, MD  cetirizine (ZYRTEC) 10 MG tablet Take 1 tablet (10 mg total) by mouth daily. 05/21/14   Mechele Claude, MD  divalproex (DEPAKOTE) 500 MG DR tablet Take 500 mg by mouth 2 (two) times daily.    Historical Provider, MD  fluticasone (FLONASE) 50 MCG/ACT nasal spray Place 2 sprays into both nostrils daily. 05/21/14   Mechele Claude, MD  glipiZIDE (GLUCOTROL) 5  MG tablet Take by mouth daily before breakfast.    Historical Provider, MD  losartan (COZAAR) 50 MG tablet Take 1 tablet (50 mg total) by mouth daily. 07/27/14   Mary-Margaret Daphine Deutscher, FNP  lurasidone (LATUDA) 80 MG TABS tablet Take 80 mg by mouth daily with breakfast.    Historical Provider, MD  metFORMIN (GLUCOPHAGE) 500 MG tablet Take by mouth 2 (two) times daily with a meal.    Historical Provider, MD  tadalafil (CIALIS) 5 MG tablet 1 po qd for BPH 04/11/14   Mary-Margaret Daphine Deutscher, FNP  tamsulosin  (FLOMAX) 0.4 MG CAPS capsule Take 0.4 mg by mouth.    Historical Provider, MD  Venlafaxine HCl 150 MG TB24 Take 150 mg by mouth daily.    Historical Provider, MD  Vitamin D, Ergocalciferol, (DRISDOL) 50000 UNITS CAPS capsule Take 50,000 Units by mouth every 7 (seven) days.    Historical Provider, MD   Triage Vitals: BP 145/89 mmHg  Pulse 85  Temp(Src) 98.4 F (36.9 C) (Oral)  Resp 18  Ht 6\' 1"  (1.854 m)  Wt 316 lb (143.337 kg)  BMI 41.70 kg/m2  SpO2 97%   Physical Exam  Constitutional: He is oriented to person, place, and time. He appears well-developed.  HENT:  Head: Normocephalic.  Eyes: Conjunctivae and EOM are normal. No scleral icterus.  Neck: Neck supple. No thyromegaly present.  Cardiovascular: Normal rate and regular rhythm.  Exam reveals no gallop and no friction rub.   No murmur heard. Pulmonary/Chest: No stridor. He has no wheezes. He has no rales. He exhibits no tenderness.  Abdominal: He exhibits no distension. There is no tenderness. There is no rebound.  Moderate right flank pain  Musculoskeletal: Normal range of motion. He exhibits no edema.  Lymphadenopathy:    He has no cervical adenopathy.  Neurological: He is oriented to person, place, and time. He exhibits normal muscle tone. Coordination normal.  Skin: No rash noted. No erythema.  Psychiatric: He has a normal mood and affect. His behavior is normal.    ED Course  Procedures (including critical care time)  DIAGNOSTIC STUDIES: Oxygen Saturation is 97% on RA, normal by my interpretation.    COORDINATION OF CARE: 8:13 PM-Discussed treatment plan which includes UA, CBC, CMP with pt at bedside and pt agreed to plan.   Labs Review Labs Reviewed - No data to display  Imaging Review No results found.   EKG Interpretation None      MDM   Final diagnoses:  None    Flank pain,  tx with dilaudid and follow up with pcp     Bethann Berkshire, MD 08/23/14 2252

## 2014-08-27 ENCOUNTER — Encounter: Payer: Self-pay | Admitting: Family Medicine

## 2014-08-27 ENCOUNTER — Ambulatory Visit (INDEPENDENT_AMBULATORY_CARE_PROVIDER_SITE_OTHER): Admitting: Family Medicine

## 2014-08-27 VITALS — BP 140/97 | HR 95 | Temp 97.9°F | Ht 73.0 in | Wt 317.0 lb

## 2014-08-27 DIAGNOSIS — N200 Calculus of kidney: Secondary | ICD-10-CM | POA: Insufficient documentation

## 2014-08-27 DIAGNOSIS — R109 Unspecified abdominal pain: Secondary | ICD-10-CM

## 2014-08-27 MED ORDER — KETOROLAC TROMETHAMINE 60 MG/2ML IM SOLN
60.0000 mg | Freq: Once | INTRAMUSCULAR | Status: AC
  Administered 2014-08-27: 60 mg via INTRAMUSCULAR

## 2014-08-27 MED ORDER — OXYCODONE-ACETAMINOPHEN 10-325 MG PO TABS: 1.0000 | ORAL_TABLET | Freq: Three times a day (TID) | ORAL | Status: DC | PRN

## 2014-08-27 NOTE — Patient Instructions (Signed)
Flank Pain °Flank pain refers to pain that is located on the side of the body between the upper abdomen and the back. The pain may occur over a short period of time (acute) or may be long-term or reoccurring (chronic). It may be mild or severe. Flank pain can be caused by many things. °CAUSES  °Some of the more common causes of flank pain include: °· Muscle strains.   °· Muscle spasms.   °· A disease of your spine (vertebral disk disease).   °· A lung infection (pneumonia).   °· Fluid around your lungs (pulmonary edema).   °· A kidney infection.   °· Kidney stones.   °· A very painful skin rash caused by the chickenpox virus (shingles).   °· Gallbladder disease.   °HOME CARE INSTRUCTIONS  °Home care will depend on the cause of your pain. In general, °· Rest as directed by your caregiver. °· Drink enough fluids to keep your urine clear or pale yellow. °· Only take over-the-counter or prescription medicines as directed by your caregiver. Some medicines may help relieve the pain. °· Tell your caregiver about any changes in your pain. °· Follow up with your caregiver as directed. °SEEK IMMEDIATE MEDICAL CARE IF:  °· Your pain is not controlled with medicine.   °· You have new or worsening symptoms. °· Your pain increases.   °· You have abdominal pain.   °· You have shortness of breath.   °· You have persistent nausea or vomiting.   °· You have swelling in your abdomen.   °· You feel faint or pass out.   °· You have blood in your urine. °· You have a fever or persistent symptoms for more than 2-3 days. °· You have a fever and your symptoms suddenly get worse. °MAKE SURE YOU:  °· Understand these instructions. °· Will watch your condition. °· Will get help right away if you are not doing well or get worse. °Document Released: 04/16/2005 Document Revised: 11/18/2011 Document Reviewed: 10/08/2011 °[employer]® Patient Information ©2015 [employer]. This information is not intended to replace advice given to you by your  health care provider. Make sure you discuss any questions you have with your health care provider. ° °

## 2014-08-27 NOTE — Progress Notes (Signed)
Subjective:    Patient ID: Travis Silva, male    DOB: 1965/06/06, 49 y.o.   MRN: [id number]  HPI 49 year old man with right flank pain. Was seen in both an [facility] and then [place] recently. Apparently had CT scan one of these institutions looking for kidney stone that could not demonstrate. He was given Dilaudid and oxycodone. He takes oxycodone in the daytime and a lot at at bedtime. He says the pain is not relieved with these medicines.    Review of Systems  HENT: Negative.   Respiratory: Negative.   Cardiovascular: Negative.   Gastrointestinal: Negative.   Genitourinary: Positive for flank pain.  Musculoskeletal: Positive for myalgias.  Psychiatric/Behavioral: Negative.    Patient Active Problem List   Diagnosis Date Noted  . Kidney stone    Outpatient Encounter Prescriptions as of 08/27/2014  Medication Sig  . albuterol (PROVENTIL HFA;VENTOLIN HFA) 108 (90 BASE) MCG/ACT inhaler Inhale 2 puffs into the lungs every 6 (six) hours as needed for wheezing or shortness of breath.  Marland Kitchen aspirin 81 MG tablet Take 81 mg by mouth daily.  Marland Kitchen atorvastatin (LIPITOR) 80 MG tablet Take 80 mg by mouth daily.  . cetirizine (ZYRTEC) 10 MG tablet Take 1 tablet (10 mg total) by mouth daily.  . divalproex (DEPAKOTE) 500 MG DR tablet Take 500 mg by mouth 2 (two) times daily.  . fluticasone (FLONASE) 50 MCG/ACT nasal spray Place 2 sprays into both nostrils daily.  Marland Kitchen glipiZIDE (GLUCOTROL) 5 MG tablet Take by mouth daily before breakfast.  . HYDROmorphone (DILAUDID) 4 MG tablet Take 1 tablet (4 mg total) by mouth every 6 (six) hours as needed for severe pain.  Marland Kitchen losartan (COZAAR) 50 MG tablet Take 1 tablet (50 mg total) by mouth daily.  Marland Kitchen lurasidone (LATUDA) 80 MG TABS tablet Take 80 mg by mouth daily with breakfast.  . metFORMIN (GLUCOPHAGE) 500 MG tablet Take by mouth 2 (two) times daily with a meal.  . tadalafil (CIALIS) 5 MG tablet 1 po qd for BPH  . tamsulosin (FLOMAX) 0.4 MG CAPS capsule Take 0.4  mg by mouth.  . Venlafaxine HCl 150 MG TB24 Take 150 mg by mouth daily.  Marland Kitchen oxyCODONE-acetaminophen (PERCOCET) 10-325 MG per tablet Take 1 tablet by mouth every 8 (eight) hours as needed for pain.  . [DISCONTINUED] Vitamin D, Ergocalciferol, (DRISDOL) 50000 UNITS CAPS capsule Take 50,000 Units by mouth every 7 (seven) days.  . [EXPIRED] ketorolac (TORADOL) injection 60 mg   . [DISCONTINUED] HYDROcodone-homatropine (HYCODAN) 5-1.5 MG/5ML syrup 5 mL    No facility-administered encounter medications on file as of 08/27/2014.       Objective:   Physical Exam  Constitutional: He is oriented to person, place, and time. He appears well-developed and well-nourished.  Cardiovascular: Normal rate and regular rhythm.   Abdominal:  There is some right flank pain to percussion. Range of motion of back with toe touching and lateral bending is negative.  Neurological: He is alert and oriented to person, place, and time.  Psychiatric: He has a normal mood and affect. His behavior is normal.          Assessment & Plan:  1. Flank pain Symptoms are really inconsistent. He does have a history of stone disease and says that this feels like what he had previously. We will increase OxyContin coat on to 10 mg and continue Dilaudid at night. Drink plenty of fluids also Toradol 60 mg IM now - Ambulatory referral to Urology - ketorolac (TORADOL) injection  60 mg; Inject 2 mLs (60 mg total) into the muscle once.

## 2014-08-29 ENCOUNTER — Telehealth: Payer: Self-pay | Admitting: Family Medicine

## 2014-08-29 NOTE — Telephone Encounter (Signed)
Referral department spoke with [facility]. They do not have an opening until August but will contact the patient directly. Patient notified and their contact number was provided.

## 2014-10-10 ENCOUNTER — Encounter (HOSPITAL_COMMUNITY): Payer: Self-pay

## 2014-10-10 ENCOUNTER — Emergency Department (HOSPITAL_COMMUNITY)

## 2014-10-10 ENCOUNTER — Observation Stay (HOSPITAL_COMMUNITY)
Admission: EM | Admit: 2014-10-10 | Discharge: 2014-10-11 | Disposition: A | Discharge status: Home / Self Care | Attending: Family Medicine | Admitting: Family Medicine

## 2014-10-10 DIAGNOSIS — K219 Gastro-esophageal reflux disease without esophagitis: Secondary | ICD-10-CM | POA: Diagnosis not present

## 2014-10-10 DIAGNOSIS — Z880 Allergy status to penicillin: Secondary | ICD-10-CM | POA: Insufficient documentation

## 2014-10-10 DIAGNOSIS — I10 Essential (primary) hypertension: Secondary | ICD-10-CM | POA: Diagnosis not present

## 2014-10-10 DIAGNOSIS — N200 Calculus of kidney: Secondary | ICD-10-CM | POA: Insufficient documentation

## 2014-10-10 DIAGNOSIS — N400 Enlarged prostate without lower urinary tract symptoms: Secondary | ICD-10-CM

## 2014-10-10 DIAGNOSIS — Z87891 Personal history of nicotine dependence: Secondary | ICD-10-CM | POA: Insufficient documentation

## 2014-10-10 DIAGNOSIS — E119 Type 2 diabetes mellitus without complications: Secondary | ICD-10-CM | POA: Insufficient documentation

## 2014-10-10 DIAGNOSIS — E785 Hyperlipidemia, unspecified: Secondary | ICD-10-CM | POA: Diagnosis not present

## 2014-10-10 DIAGNOSIS — R0789 Other chest pain: Principal | ICD-10-CM | POA: Insufficient documentation

## 2014-10-10 DIAGNOSIS — Z79899 Other long term (current) drug therapy: Secondary | ICD-10-CM | POA: Insufficient documentation

## 2014-10-10 DIAGNOSIS — G4730 Sleep apnea, unspecified: Secondary | ICD-10-CM | POA: Diagnosis present

## 2014-10-10 DIAGNOSIS — R51 Headache: Secondary | ICD-10-CM | POA: Diagnosis not present

## 2014-10-10 DIAGNOSIS — R079 Chest pain, unspecified: Secondary | ICD-10-CM | POA: Diagnosis not present

## 2014-10-10 DIAGNOSIS — Z7982 Long term (current) use of aspirin: Secondary | ICD-10-CM | POA: Diagnosis not present

## 2014-10-10 DIAGNOSIS — F4310 Post-traumatic stress disorder, unspecified: Secondary | ICD-10-CM | POA: Insufficient documentation

## 2014-10-10 DIAGNOSIS — E669 Obesity, unspecified: Secondary | ICD-10-CM | POA: Diagnosis not present

## 2014-10-10 DIAGNOSIS — R519 Headache, unspecified: Secondary | ICD-10-CM | POA: Diagnosis present

## 2014-10-10 HISTORY — DX: Sleep apnea, unspecified: G47.30

## 2014-10-10 LAB — COMPREHENSIVE METABOLIC PANEL
ALK PHOS: 54 U/L (ref 38–126)
ALT: 45 U/L (ref 17–63)
ANION GAP: 9 (ref 5–15)
AST: 37 U/L (ref 15–41)
Albumin: 4.1 g/dL (ref 3.5–5.0)
BUN: 15 mg/dL (ref 6–20)
CO2: 25 mmol/L (ref 22–32)
CREATININE: 0.94 mg/dL (ref 0.61–1.24)
Calcium: 9 mg/dL (ref 8.9–10.3)
Chloride: 105 mmol/L (ref 101–111)
GFR calc Af Amer: >60 mL/min (ref >60)
GLUCOSE: 108 mg/dL — AB (ref 65–99)
Potassium: 4 mmol/L (ref 3.5–5.1)
SODIUM: 139 mmol/L (ref 135–145)
Total Bilirubin: 1 mg/dL (ref 0.3–1.2)
Total Protein: 6.8 g/dL (ref 6.5–8.1)

## 2014-10-10 LAB — GLUCOSE, CAPILLARY: GLUCOSE-CAPILLARY: 89 mg/dL (ref 65–99)

## 2014-10-10 LAB — CBC WITH DIFFERENTIAL/PLATELET
BASOS ABS: 0 10*3/uL (ref 0.0–0.1)
Basophils Relative: 1 % (ref 0–1)
EOS ABS: 0.2 10*3/uL (ref 0.0–0.7)
EOS PCT: 3 % (ref 0–5)
HCT: 42.8 % (ref 39.0–52.0)
Hemoglobin: 14.3 g/dL (ref 13.0–17.0)
LYMPHS ABS: 3.6 10*3/uL (ref 0.7–4.0)
Lymphocytes Relative: 45 % (ref 12–46)
MCH: 29.5 pg (ref 26.0–34.0)
MCHC: 33.4 g/dL (ref 30.0–36.0)
MCV: 88.2 fL (ref 78.0–100.0)
MONO ABS: 0.8 10*3/uL (ref 0.1–1.0)
Monocytes Relative: 10 % (ref 3–12)
NEUTROS ABS: 3.4 10*3/uL (ref 1.7–7.7)
Neutrophils Relative %: 43 % (ref 43–77)
PLATELETS: 269 10*3/uL (ref 150–400)
RBC: 4.85 MIL/uL (ref 4.22–5.81)
RDW: 12.5 % (ref 11.5–15.5)
WBC: 8 10*3/uL (ref 4.0–10.5)

## 2014-10-10 LAB — TROPONIN I: Troponin I: <0.03 ng/mL (ref <0.031)

## 2014-10-10 MED ORDER — SODIUM CHLORIDE 0.9 % IJ SOLN
INTRAMUSCULAR | Status: AC
  Filled 2014-10-10: qty 500

## 2014-10-10 MED ORDER — IOHEXOL 350 MG/ML SOLN
75.0000 mL | Freq: Once | INTRAVENOUS | Status: AC | PRN
  Administered 2014-10-10: 75 mL via INTRAVENOUS

## 2014-10-10 MED ORDER — ONDANSETRON HCL 4 MG/2ML IJ SOLN: 4.0000 mg | Freq: Four times a day (QID) | INTRAMUSCULAR | Status: DC | PRN

## 2014-10-10 MED ORDER — ALBUTEROL SULFATE HFA 108 (90 BASE) MCG/ACT IN AERS
2.0000 | INHALATION_SPRAY | Freq: Four times a day (QID) | RESPIRATORY_TRACT | Status: DC | PRN
  Filled 2014-10-10: qty 6.7

## 2014-10-10 MED ORDER — ACETAMINOPHEN 325 MG PO TABS
650.0000 mg | ORAL_TABLET | ORAL | Status: DC | PRN
  Filled 2014-10-10: qty 2

## 2014-10-10 MED ORDER — TAMSULOSIN HCL 0.4 MG PO CAPS
0.4000 mg | ORAL_CAPSULE | Freq: Every day | ORAL | Status: DC
  Administered 2014-10-10: 0.4 mg via ORAL
  Filled 2014-10-10: qty 1

## 2014-10-10 MED ORDER — INSULIN ASPART 100 UNIT/ML ~~LOC~~ SOLN: 0.0000 [IU] | Freq: Three times a day (TID) | SUBCUTANEOUS | Status: DC

## 2014-10-10 MED ORDER — LURASIDONE HCL 40 MG PO TABS
80.0000 mg | ORAL_TABLET | Freq: Every day | ORAL | Status: DC
  Filled 2014-10-10 (×2): qty 1

## 2014-10-10 MED ORDER — INSULIN ASPART 100 UNIT/ML ~~LOC~~ SOLN: 0.0000 [IU] | Freq: Every day | SUBCUTANEOUS | Status: DC

## 2014-10-10 MED ORDER — VENLAFAXINE HCL ER 150 MG PO TB24: 150.0000 mg | ORAL_TABLET | Freq: Every day | ORAL | Status: DC

## 2014-10-10 MED ORDER — FLUTICASONE PROPIONATE 50 MCG/ACT NA SUSP
2.0000 | Freq: Every day | NASAL | Status: DC
  Administered 2014-10-11: 2 via NASAL
  Filled 2014-10-10 (×2): qty 16

## 2014-10-10 MED ORDER — TADALAFIL 5 MG PO TABS: 5.0000 mg | ORAL_TABLET | Freq: Every day | ORAL | Status: DC | PRN

## 2014-10-10 MED ORDER — SODIUM CHLORIDE 0.9 % IJ SOLN
INTRAMUSCULAR | Status: AC
  Filled 2014-10-10: qty 30

## 2014-10-10 MED ORDER — METFORMIN HCL 500 MG PO TABS
500.0000 mg | ORAL_TABLET | Freq: Two times a day (BID) | ORAL | Status: DC
  Administered 2014-10-11: 500 mg via ORAL
  Filled 2014-10-10: qty 1

## 2014-10-10 MED ORDER — ASPIRIN 81 MG PO TABS
81.0000 mg | ORAL_TABLET | Freq: Every day | ORAL | Status: DC
  Filled 2014-10-10 (×4): qty 1

## 2014-10-10 MED ORDER — DIVALPROEX SODIUM 250 MG PO DR TAB
500.0000 mg | DELAYED_RELEASE_TABLET | Freq: Two times a day (BID) | ORAL | Status: DC
  Administered 2014-10-10 – 2014-10-11 (×2): 500 mg via ORAL
  Filled 2014-10-10 (×2): qty 2

## 2014-10-10 MED ORDER — ENOXAPARIN SODIUM 40 MG/0.4ML ~~LOC~~ SOLN
40.0000 mg | SUBCUTANEOUS | Status: DC
  Administered 2014-10-10: 40 mg via SUBCUTANEOUS
  Filled 2014-10-10: qty 0.4

## 2014-10-10 MED ORDER — SODIUM CHLORIDE 0.9 % IV BOLUS (SEPSIS)
1000.0000 mL | Freq: Once | INTRAVENOUS | Status: AC
  Administered 2014-10-10: 1000 mL via INTRAVENOUS

## 2014-10-10 MED ORDER — ONDANSETRON HCL 4 MG/2ML IJ SOLN: 4.0000 mg | Freq: Three times a day (TID) | INTRAMUSCULAR | Status: AC | PRN

## 2014-10-10 MED ORDER — GLIPIZIDE 5 MG PO TABS
5.0000 mg | ORAL_TABLET | Freq: Every day | ORAL | Status: DC
  Administered 2014-10-11: 5 mg via ORAL
  Filled 2014-10-10 (×3): qty 1

## 2014-10-10 MED ORDER — ASPIRIN 81 MG PO CHEW
324.0000 mg | CHEWABLE_TABLET | Freq: Once | ORAL | Status: AC
  Administered 2014-10-10: 324 mg via ORAL
  Filled 2014-10-10: qty 4

## 2014-10-10 MED ORDER — LOSARTAN POTASSIUM 50 MG PO TABS
50.0000 mg | ORAL_TABLET | Freq: Every day | ORAL | Status: DC
  Administered 2014-10-10 – 2014-10-11 (×2): 50 mg via ORAL
  Filled 2014-10-10 (×5): qty 1

## 2014-10-10 MED ORDER — ATORVASTATIN CALCIUM 40 MG PO TABS
80.0000 mg | ORAL_TABLET | Freq: Every day | ORAL | Status: DC
  Administered 2014-10-10 – 2014-10-11 (×2): 80 mg via ORAL
  Filled 2014-10-10: qty 2
  Filled 2014-10-10: qty 1
  Filled 2014-10-10: qty 2
  Filled 2014-10-10 (×2): qty 1

## 2014-10-10 NOTE — H&P (Signed)
[facility] History and Physical  Lynda Capistran WUJ:[id number] DOB: 30-Aug-1965 DOA: 10/10/2014  Referring physician: ER PCP: Bennie Pierini, FNP   Chief Complaint: Chest pain/tightness  HPI: Travis Silva is a 49 y.o. male  This is a 49 year old man, diabetic, obese, hyperlipidemia who now presents with chest tightness today which started at 9:30 AM. It lasted approximately one half hours and when he into the emergency room, it has eased off since this time. Yesterday, he was extremely stressed at work and managed to calm down when he went home. This morning, he began to have the chest tightness together with headache. The chest tightness did not radiate and was not associated with dyspnea, nausea, sweating. The headache was not associated with photophobia. All his symptoms seem to be improving at this point in time. However, he does have multiple risk factors for coronary artery disease including his father who had myocardial infarction at the age of 90. He is now being admitted for further investigation.   Review of Systems:  Apart from symptoms above, all systems negative.  Past Medical History  Diagnosis Date  . PTSD (post-traumatic stress disorder)   . Diabetes mellitus without complication   . Hyperlipidemia   . Hypertension   . GERD (gastroesophageal reflux disease)   . Kidney stone    Past Surgical History  Procedure Laterality Date  . Appendectomy    . Knee surgery     Social History:  reports that he quit smoking about 3 years ago. His smoking use included Cigarettes. He uses smokeless tobacco. He reports that he does not drink alcohol or use illicit drugs.  Allergies  Allergen Reactions  . Penicillins Anaphylaxis  . Ace Inhibitors Cough    Family History  Problem Relation Age of Onset  . Diabetes Father     Prior to Admission medications   Medication Sig Start Date End Date Taking? Authorizing Provider  albuterol (PROVENTIL HFA;VENTOLIN HFA)  108 (90 BASE) MCG/ACT inhaler Inhale 2 puffs into the lungs every 6 (six) hours as needed for wheezing or shortness of breath. 05/10/14  Yes Mechele Claude, MD  aspirin 81 MG tablet Take 81 mg by mouth daily.   Yes Historical Provider, MD  atorvastatin (LIPITOR) 80 MG tablet Take 80 mg by mouth daily.   Yes Historical Provider, MD  divalproex (DEPAKOTE) 500 MG DR tablet Take 500 mg by mouth 2 (two) times daily.   Yes Historical Provider, MD  fluticasone (FLONASE) 50 MCG/ACT nasal spray Place 2 sprays into both nostrils daily. 05/21/14  Yes Mechele Claude, MD  glipiZIDE (GLUCOTROL) 5 MG tablet Take 5 mg by mouth daily before breakfast.    Yes Historical Provider, MD  losartan (COZAAR) 50 MG tablet Take 1 tablet (50 mg total) by mouth daily. 07/27/14  Yes Mary-Margaret Daphine Deutscher, FNP  lurasidone (LATUDA) 80 MG TABS tablet Take 80 mg by mouth daily with breakfast.   Yes Historical Provider, MD  metFORMIN (GLUCOPHAGE) 500 MG tablet Take 500 mg by mouth 2 (two) times daily with a meal.    Yes Historical Provider, MD  tadalafil (CIALIS) 5 MG tablet 1 po qd for BPH Patient taking differently: Take 5 mg by mouth daily as needed for erectile dysfunction. 1 po qd for BPH 04/11/14  Yes Mary-Margaret Daphine Deutscher, FNP  tamsulosin (FLOMAX) 0.4 MG CAPS capsule Take 0.4 mg by mouth.   Yes Historical Provider, MD  Venlafaxine HCl 150 MG TB24 Take 150 mg by mouth daily.   Yes Historical Provider, MD  cetirizine (  ZYRTEC) 10 MG tablet Take 1 tablet (10 mg total) by mouth daily. Patient not taking: Reported on 10/10/2014 05/21/14   Mechele Claude, MD   Physical Exam: Filed Vitals:   10/10/14 1600 10/10/14 1630 10/10/14 1700 10/10/14 1730  BP: 124/75 120/75 127/87 140/63  Pulse: 81 75 81 74  Temp:      TempSrc:      Resp: 18 21 21 21   Height:      Weight:      SpO2: 97% 97% 100% 98%    Wt Readings from Last 3 Encounters:  10/10/14 136.079 kg (300 lb)  08/27/14 143.79 kg (317 lb)  08/23/14 143.337 kg (316 lb)    General:   Appears calm and comfortable. Obese. Eyes: PERRL, normal lids, irises & conjunctiva ENT: grossly normal hearing, lips & tongue Neck: no LAD, masses or thyromegaly Cardiovascular: RRR, no m/r/g. No LE edema. Telemetry: SR, no arrhythmias  Respiratory: CTA bilaterally, no w/r/r. Normal respiratory effort. Abdomen: soft, ntnd Skin: no rash or induration seen on limited exam Musculoskeletal: grossly normal tone BUE/BLE Psychiatric: grossly normal mood and affect, speech fluent and appropriate Neurologic: grossly non-focal.          Labs on Admission:  Basic Metabolic Panel:  Recent Labs Lab 10/10/14 1130  NA 139  K 4.0  CL 105  CO2 25  GLUCOSE 108*  BUN 15  CREATININE 0.94  CALCIUM 9.0   Liver Function Tests:  Recent Labs Lab 10/10/14 1130  AST 37  ALT 45  ALKPHOS 54  BILITOT 1.0  PROT 6.8  ALBUMIN 4.1   No results for input(s): LIPASE, AMYLASE in the last 168 hours. No results for input(s): AMMONIA in the last 168 hours. CBC:  Recent Labs Lab 10/10/14 1130  WBC 8.0  NEUTROABS 3.4  HGB 14.3  HCT 42.8  MCV 88.2  PLT 269   Cardiac Enzymes:  Recent Labs Lab 10/10/14 1130 10/10/14 1521  TROPONINI <0.03 <0.03    BNP (last 3 results) No results for input(s): BNP in the last 8760 hours.  ProBNP (last 3 results) No results for input(s): PROBNP in the last 8760 hours.  CBG: No results for input(s): GLUCAP in the last 168 hours.  Radiological Exams on Admission: Ct Angio Head W/cm &/or Wo Cm  10/10/2014   CLINICAL DATA:  Severe headache.  Acute onset this morning.  EXAM: CT ANGIOGRAPHY HEAD AND NECK  TECHNIQUE: Multidetector CT imaging of the head and neck was performed using the standard protocol during bolus administration of intravenous contrast. Multiplanar CT image reconstructions and MIPs were obtained to evaluate the vascular anatomy. Carotid stenosis measurements (when applicable) are obtained utilizing NASCET criteria, using the distal internal  carotid diameter as the denominator.  CONTRAST:  75mL OMNIPAQUE IOHEXOL 350 MG/ML SOLN  COMPARISON:  None.  FINDINGS: CT HEAD  The brain has a normal appearance without evidence of atrophy, infarction, mass lesion, hemorrhage, hydrocephalus or extra-axial collection. The calvarium is unremarkable. The paranasal sinuses, middle ears and mastoids are clear.  CTA NECK  Aortic arch: No evidence of atherosclerosis, dissection or aneurysm. Branching pattern of the brachiocephalic vessels from the arch is normal.  Right carotid system: Common carotid artery widely patent to the bifurcation. Minimal atherosclerotic calcification at the bifurcation but no stenosis or irregularity. Cervical internal carotid artery is normal.  Left carotid system: Left common carotid artery widely patent to the bifurcation. Minimal atherosclerotic calcification of the bifurcation but no narrowing or irregularity. Cervical internal carotid artery is normal.  Vertebral arteries:Both vertebral arteries approximately equal in size. Origins not well seen because of shoulder density. No stenosis suspected. Vessels appear widely patent in the upper neck.  Skeleton: Ordinary cervical spondylosis  Other neck: No significant finding.  Lung apices are clear.  CTA HEAD  Anterior circulation: Both internal carotid arteries are widely patent through the siphon region. The anterior and middle cerebral vessels are normal without proximal stenosis, aneurysm or vascular malformation.  Posterior circulation: Both vertebral arteries are patent to the basilar. No basilar stenosis. Posterior circulation branch vessels are normal.  Venous sinuses: Patent and normal  Anatomic variants: None significant  Delayed phase: No abnormal enhancement  IMPRESSION: No evidence of intracranial hemorrhage or other imaging abnormality to explain headache.  Normal CT angiography with the exception of minimal, non stenotic atherosclerotic calcification at the carotid bifurcations.    Electronically Signed   By: Paulina Fusi M.D.   On: 10/10/2014 15:23   Dg Chest 2 View  10/10/2014   CLINICAL DATA:  Chest pain, headache  EXAM: CHEST  2 VIEW  COMPARISON:  None.  FINDINGS: The heart size and mediastinal contours are within normal limits. Both lungs are clear. Mild degenerative changes lower thoracic spine.  IMPRESSION: No active cardiopulmonary disease.   Electronically Signed   By: Natasha Mead M.D.   On: 10/10/2014 12:49   Ct Angio Neck W/cm &/or Wo/cm  10/10/2014   CLINICAL DATA:  Severe headache.  Acute onset this morning.  EXAM: CT ANGIOGRAPHY HEAD AND NECK  TECHNIQUE: Multidetector CT imaging of the head and neck was performed using the standard protocol during bolus administration of intravenous contrast. Multiplanar CT image reconstructions and MIPs were obtained to evaluate the vascular anatomy. Carotid stenosis measurements (when applicable) are obtained utilizing NASCET criteria, using the distal internal carotid diameter as the denominator.  CONTRAST:  75mL OMNIPAQUE IOHEXOL 350 MG/ML SOLN  COMPARISON:  None.  FINDINGS: CT HEAD  The brain has a normal appearance without evidence of atrophy, infarction, mass lesion, hemorrhage, hydrocephalus or extra-axial collection. The calvarium is unremarkable. The paranasal sinuses, middle ears and mastoids are clear.  CTA NECK  Aortic arch: No evidence of atherosclerosis, dissection or aneurysm. Branching pattern of the brachiocephalic vessels from the arch is normal.  Right carotid system: Common carotid artery widely patent to the bifurcation. Minimal atherosclerotic calcification at the bifurcation but no stenosis or irregularity. Cervical internal carotid artery is normal.  Left carotid system: Left common carotid artery widely patent to the bifurcation. Minimal atherosclerotic calcification of the bifurcation but no narrowing or irregularity. Cervical internal carotid artery is normal.  Vertebral arteries:Both vertebral arteries  approximately equal in size. Origins not well seen because of shoulder density. No stenosis suspected. Vessels appear widely patent in the upper neck.  Skeleton: Ordinary cervical spondylosis  Other neck: No significant finding.  Lung apices are clear.  CTA HEAD  Anterior circulation: Both internal carotid arteries are widely patent through the siphon region. The anterior and middle cerebral vessels are normal without proximal stenosis, aneurysm or vascular malformation.  Posterior circulation: Both vertebral arteries are patent to the basilar. No basilar stenosis. Posterior circulation branch vessels are normal.  Venous sinuses: Patent and normal  Anatomic variants: None significant  Delayed phase: No abnormal enhancement  IMPRESSION: No evidence of intracranial hemorrhage or other imaging abnormality to explain headache.  Normal CT angiography with the exception of minimal, non stenotic atherosclerotic calcification at the carotid bifurcations.   Electronically Signed   By: Paulina Fusi  M.D.   On: 10/10/2014 15:23    EKG: Independently reviewed. Normal sinus rhythm without any acute ST-T wave changes.  Assessment/Plan   1. Chest tightness/pain. Currently he does not having evidence of cardiac ischemia. However, he has multiple risk factors and we will admit him to telemetry and cycle cardiac enzymes. I will request cardiology consultation. He may require stress testing. 2. Diabetes. Continue with home medications and sliding scale of insulin. 3. Headache. This may be related to stress. There does not appear to be any obvious pathology in his brain at the present time and neurologically there are no abnormal physical findings. We will monitor this closely.  Further recommendations will depend on patient's hospital progress.   Code Status: Full code.  DVT Prophylaxis: Heparin.   Family Communication: I discussed the plan with the patient at the bedside.   Disposition Plan: Home when medically  stable.   Time spent: 60 minutes.  Wilson Singer [facility] Pager [phone].

## 2014-10-10 NOTE — ED Provider Notes (Signed)
CSN: [id number]     Arrival date & time 10/10/14  1058 History   First MD Initiated Contact with Patient 10/10/14 1200     Chief Complaint  Patient presents with  . Chest Pain     (Consider location/radiation/quality/duration/timing/severity/associated sxs/prior Treatment) HPI Comments: 49 year old male with past medical history including type 2 diabetes mellitus, hypertension, hyperlipidemia, GERD who presents with chest pain and headache. The patient states that a few hours prior to arrival, he was at work and had a sudden onset of severe, central headache which felt like vessels were pulsating in his head. At the same time, he developed central, nonradiating chest tightness that was associated with nausea but no shortness of breath or diaphoresis. The pain resolved spontaneously after proximally 45 minutes. The headache has been persistent. No changes in his vision or focal numbness/weakness. He has had migraines in the past but this does not feel like a migraine. Patient has occasionally had chest pain that he has not sought medical attention for. Patient does note that he has significant stress at work recently. No fevers, cough/cold symptoms, abdominal pain, or recent illness.  Family history notable for father with first MI at age 68 and a second MI in his 2s. Maternal grandmother died of an MI at age 70.  Patient is a 49 y.o. male presenting with chest pain. The history is provided by the patient.  Chest Pain   Past Medical History  Diagnosis Date  . PTSD (post-traumatic stress disorder)   . Diabetes mellitus without complication   . Hyperlipidemia   . Hypertension   . GERD (gastroesophageal reflux disease)   . Kidney stone    Past Surgical History  Procedure Laterality Date  . Appendectomy    . Knee surgery     Family History  Problem Relation Age of Onset  . Diabetes Father    History  Substance Use Topics  . Smoking status: Former Smoker    Types: Cigarettes    Quit  date: 06/28/2011  . Smokeless tobacco: Current User  . Alcohol Use: No    Review of Systems  Cardiovascular: Positive for chest pain.   10 Systems reviewed and are negative for acute change except as noted in the HPI.    Allergies  Penicillins and Ace inhibitors  Home Medications   Prior to Admission medications   Medication Sig Start Date End Date Taking? Authorizing Provider  albuterol (PROVENTIL HFA;VENTOLIN HFA) 108 (90 BASE) MCG/ACT inhaler Inhale 2 puffs into the lungs every 6 (six) hours as needed for wheezing or shortness of breath. 05/10/14  Yes Mechele Claude, MD  aspirin 81 MG tablet Take 81 mg by mouth daily.   Yes Historical Provider, MD  atorvastatin (LIPITOR) 80 MG tablet Take 80 mg by mouth daily.   Yes Historical Provider, MD  divalproex (DEPAKOTE) 500 MG DR tablet Take 500 mg by mouth 2 (two) times daily.   Yes Historical Provider, MD  fluticasone (FLONASE) 50 MCG/ACT nasal spray Place 2 sprays into both nostrils daily. 05/21/14  Yes Mechele Claude, MD  glipiZIDE (GLUCOTROL) 5 MG tablet Take 5 mg by mouth daily before breakfast.    Yes Historical Provider, MD  losartan (COZAAR) 50 MG tablet Take 1 tablet (50 mg total) by mouth daily. 07/27/14  Yes Mary-Margaret Daphine Deutscher, FNP  lurasidone (LATUDA) 80 MG TABS tablet Take 80 mg by mouth daily with breakfast.   Yes Historical Provider, MD  metFORMIN (GLUCOPHAGE) 500 MG tablet Take 500 mg by mouth 2 (two)  times daily with a meal.    Yes Historical Provider, MD  tadalafil (CIALIS) 5 MG tablet 1 po qd for BPH Patient taking differently: Take 5 mg by mouth daily as needed for erectile dysfunction. 1 po qd for BPH 04/11/14  Yes Mary-Margaret Daphine Deutscher, FNP  tamsulosin (FLOMAX) 0.4 MG CAPS capsule Take 0.4 mg by mouth.   Yes Historical Provider, MD  Venlafaxine HCl 150 MG TB24 Take 150 mg by mouth daily.   Yes Historical Provider, MD  cetirizine (ZYRTEC) 10 MG tablet Take 1 tablet (10 mg total) by mouth daily. Patient not taking: Reported  on 10/10/2014 05/21/14   Mechele Claude, MD   BP 124/85 mmHg  Pulse 87  Temp(Src) 98.3 F (36.8 C) (Oral)  Resp 24  Ht 6\' 1"  (1.854 m)  Wt 300 lb 14.4 oz (136.487 kg)  BMI 39.71 kg/m2  SpO2 97% Physical Exam  Constitutional: He is oriented to person, place, and time. He appears well-developed and well-nourished. No distress.  Awake, alert  HENT:  Head: Normocephalic and atraumatic.  Eyes: Conjunctivae and EOM are normal. Pupils are equal, round, and reactive to light.  Neck: Neck supple.  Cardiovascular: Normal rate, regular rhythm and normal heart sounds.   No murmur heard. Pulmonary/Chest: Effort normal and breath sounds normal. No respiratory distress.  Abdominal: Soft. Bowel sounds are normal. He exhibits no distension.  Musculoskeletal: He exhibits no edema.  Neurological: He is alert and oriented to person, place, and time. He has normal reflexes. No cranial nerve deficit. He exhibits normal muscle tone.  Fluent speech, normal finger-to-nose testing, negative pronator drift  Skin: Skin is warm and dry.  Psychiatric: He has a normal mood and affect. Judgment and thought content normal.  Nursing note and vitals reviewed.   ED Course  Procedures (including critical care time) Labs Review Labs Reviewed  COMPREHENSIVE METABOLIC PANEL - Abnormal; Notable for the following:    Glucose, Bld 108 (*)    All other components within normal limits  CBC WITH DIFFERENTIAL/PLATELET  TROPONIN I  TROPONIN I  TROPONIN I  GLUCOSE, CAPILLARY  TROPONIN I  TROPONIN I    Imaging Review Ct Angio Head W/cm &/or Wo Cm  10/10/2014   CLINICAL DATA:  Severe headache.  Acute onset this morning.  EXAM: CT ANGIOGRAPHY HEAD AND NECK  TECHNIQUE: Multidetector CT imaging of the head and neck was performed using the standard protocol during bolus administration of intravenous contrast. Multiplanar CT image reconstructions and MIPs were obtained to evaluate the vascular anatomy. Carotid stenosis  measurements (when applicable) are obtained utilizing NASCET criteria, using the distal internal carotid diameter as the denominator.  CONTRAST:  75mL OMNIPAQUE IOHEXOL 350 MG/ML SOLN  COMPARISON:  None.  FINDINGS: CT HEAD  The brain has a normal appearance without evidence of atrophy, infarction, mass lesion, hemorrhage, hydrocephalus or extra-axial collection. The calvarium is unremarkable. The paranasal sinuses, middle ears and mastoids are clear.  CTA NECK  Aortic arch: No evidence of atherosclerosis, dissection or aneurysm. Branching pattern of the brachiocephalic vessels from the arch is normal.  Right carotid system: Common carotid artery widely patent to the bifurcation. Minimal atherosclerotic calcification at the bifurcation but no stenosis or irregularity. Cervical internal carotid artery is normal.  Left carotid system: Left common carotid artery widely patent to the bifurcation. Minimal atherosclerotic calcification of the bifurcation but no narrowing or irregularity. Cervical internal carotid artery is normal.  Vertebral arteries:Both vertebral arteries approximately equal in size. Origins not well seen because of shoulder  density. No stenosis suspected. Vessels appear widely patent in the upper neck.  Skeleton: Ordinary cervical spondylosis  Other neck: No significant finding.  Lung apices are clear.  CTA HEAD  Anterior circulation: Both internal carotid arteries are widely patent through the siphon region. The anterior and middle cerebral vessels are normal without proximal stenosis, aneurysm or vascular malformation.  Posterior circulation: Both vertebral arteries are patent to the basilar. No basilar stenosis. Posterior circulation branch vessels are normal.  Venous sinuses: Patent and normal  Anatomic variants: None significant  Delayed phase: No abnormal enhancement  IMPRESSION: No evidence of intracranial hemorrhage or other imaging abnormality to explain headache.  Normal CT angiography with  the exception of minimal, non stenotic atherosclerotic calcification at the carotid bifurcations.   Electronically Signed   By: Paulina Fusi M.D.   On: 10/10/2014 15:23   Dg Chest 2 View  10/10/2014   CLINICAL DATA:  Chest pain, headache  EXAM: CHEST  2 VIEW  COMPARISON:  None.  FINDINGS: The heart size and mediastinal contours are within normal limits. Both lungs are clear. Mild degenerative changes lower thoracic spine.  IMPRESSION: No active cardiopulmonary disease.   Electronically Signed   By: Natasha Mead M.D.   On: 10/10/2014 12:49   Ct Angio Neck W/cm &/or Wo/cm  10/10/2014   CLINICAL DATA:  Severe headache.  Acute onset this morning.  EXAM: CT ANGIOGRAPHY HEAD AND NECK  TECHNIQUE: Multidetector CT imaging of the head and neck was performed using the standard protocol during bolus administration of intravenous contrast. Multiplanar CT image reconstructions and MIPs were obtained to evaluate the vascular anatomy. Carotid stenosis measurements (when applicable) are obtained utilizing NASCET criteria, using the distal internal carotid diameter as the denominator.  CONTRAST:  75mL OMNIPAQUE IOHEXOL 350 MG/ML SOLN  COMPARISON:  None.  FINDINGS: CT HEAD  The brain has a normal appearance without evidence of atrophy, infarction, mass lesion, hemorrhage, hydrocephalus or extra-axial collection. The calvarium is unremarkable. The paranasal sinuses, middle ears and mastoids are clear.  CTA NECK  Aortic arch: No evidence of atherosclerosis, dissection or aneurysm. Branching pattern of the brachiocephalic vessels from the arch is normal.  Right carotid system: Common carotid artery widely patent to the bifurcation. Minimal atherosclerotic calcification at the bifurcation but no stenosis or irregularity. Cervical internal carotid artery is normal.  Left carotid system: Left common carotid artery widely patent to the bifurcation. Minimal atherosclerotic calcification of the bifurcation but no narrowing or irregularity.  Cervical internal carotid artery is normal.  Vertebral arteries:Both vertebral arteries approximately equal in size. Origins not well seen because of shoulder density. No stenosis suspected. Vessels appear widely patent in the upper neck.  Skeleton: Ordinary cervical spondylosis  Other neck: No significant finding.  Lung apices are clear.  CTA HEAD  Anterior circulation: Both internal carotid arteries are widely patent through the siphon region. The anterior and middle cerebral vessels are normal without proximal stenosis, aneurysm or vascular malformation.  Posterior circulation: Both vertebral arteries are patent to the basilar. No basilar stenosis. Posterior circulation branch vessels are normal.  Venous sinuses: Patent and normal  Anatomic variants: None significant  Delayed phase: No abnormal enhancement  IMPRESSION: No evidence of intracranial hemorrhage or other imaging abnormality to explain headache.  Normal CT angiography with the exception of minimal, non stenotic atherosclerotic calcification at the carotid bifurcations.   Electronically Signed   By: Paulina Fusi M.D.   On: 10/10/2014 15:23     EKG Interpretation   Date/Time:  Wednesday October 10 2014 11:05:24 EDT Ventricular Rate:  86 PR Interval:  174 QRS Duration: 88 QT Interval:  370 QTC Calculation: 442 R Axis:   -24 Text Interpretation:  Normal sinus rhythm Normal ECG Confirmed by  ZACKOWSKI  MD, SCOTT ([id number]) on 10/10/2014 11:11:21 AM      MDM   Final diagnoses:  Headache  Other chest pain   49 year old male who presents with a sudden onset of headache as well as chest tightness that began a few hours prior to arrival. Patient well-appearing with normal vital signs at presentation. No neurologic deficits on exam. EKG without ischemic changes or evidence of LVH. Obtained chest x-ray which was unremarkable. The patient's sudden onset of headache which was unlike previous migraines was concerning for possible intracranial  process such as subarachnoid hemorrhage. Obtained CT head as well as CTA of head and neck to evaluate for intracranial bleed. CTs were unremarkable and because the patient presented within 6 hours of onset, I feel that it is extremely unlikely that his HA is due to [facility]. Gave patient aspirin 325mg . the patient has no risk factors for PE and is PERC negative.   Regarding his chest pain, the patient has signficant risk factors for CAD and a strong FH of early heart disease. His HEART score is 4-5. I have explained workup to patient and have recommended admission for chest pain rule out. The patient is agreeable to plan and patient admitted to general medicine for further care.  Laurence Spates, MD 10/10/14 2214

## 2014-10-10 NOTE — Progress Notes (Signed)
Notified MD, pt states he wears CPAP at night. Awaiting orders.

## 2014-10-10 NOTE — ED Notes (Signed)
Attempted to call report to floor RN receiving patient - RN unable at this time. Report to Cay Schillings, ED.

## 2014-10-10 NOTE — ED Notes (Signed)
Pt reports was at work this morning and started having a headache and tightness in chest.  Reports has progressively been getting tired today.

## 2014-10-10 NOTE — ED Notes (Signed)
Patient transported to X-ray 

## 2014-10-11 ENCOUNTER — Encounter (HOSPITAL_COMMUNITY): Payer: Self-pay | Admitting: Cardiology

## 2014-10-11 ENCOUNTER — Other Ambulatory Visit: Payer: Self-pay | Admitting: Cardiology

## 2014-10-11 DIAGNOSIS — E785 Hyperlipidemia, unspecified: Secondary | ICD-10-CM | POA: Diagnosis not present

## 2014-10-11 DIAGNOSIS — E119 Type 2 diabetes mellitus without complications: Secondary | ICD-10-CM

## 2014-10-11 DIAGNOSIS — R0789 Other chest pain: Secondary | ICD-10-CM | POA: Diagnosis not present

## 2014-10-11 DIAGNOSIS — R079 Chest pain, unspecified: Secondary | ICD-10-CM | POA: Diagnosis not present

## 2014-10-11 DIAGNOSIS — G4730 Sleep apnea, unspecified: Secondary | ICD-10-CM | POA: Diagnosis present

## 2014-10-11 DIAGNOSIS — I10 Essential (primary) hypertension: Secondary | ICD-10-CM | POA: Diagnosis present

## 2014-10-11 DIAGNOSIS — F4310 Post-traumatic stress disorder, unspecified: Secondary | ICD-10-CM | POA: Diagnosis not present

## 2014-10-11 DIAGNOSIS — R51 Headache: Secondary | ICD-10-CM | POA: Diagnosis not present

## 2014-10-11 LAB — GLUCOSE, CAPILLARY
Glucose-Capillary: 113 mg/dL — ABNORMAL HIGH (ref 65–99)
Glucose-Capillary: 58 mg/dL — ABNORMAL LOW (ref 65–99)
Glucose-Capillary: 77 mg/dL (ref 65–99)

## 2014-10-11 LAB — TROPONIN I

## 2014-10-11 MED ORDER — VENLAFAXINE HCL ER 75 MG PO CP24
150.0000 mg | ORAL_CAPSULE | Freq: Every day | ORAL | Status: DC
  Administered 2014-10-11: 150 mg via ORAL
  Filled 2014-10-11: qty 2

## 2014-10-11 MED ORDER — TADALAFIL 5 MG PO TABS: 5.0000 mg | ORAL_TABLET | Freq: Every day | ORAL | Status: DC | PRN

## 2014-10-11 MED ORDER — LURASIDONE HCL 40 MG PO TABS
80.0000 mg | ORAL_TABLET | Freq: Every day | ORAL | Status: DC
  Filled 2014-10-11: qty 2

## 2014-10-11 MED ORDER — ASPIRIN EC 81 MG PO TBEC
81.0000 mg | DELAYED_RELEASE_TABLET | Freq: Every day | ORAL | Status: DC
  Administered 2014-10-11: 81 mg via ORAL
  Filled 2014-10-11: qty 1

## 2014-10-11 MED ORDER — VENLAFAXINE HCL ER 75 MG PO CP24: 150.0000 mg | ORAL_CAPSULE | Freq: Every day | ORAL | Status: DC

## 2014-10-11 NOTE — Progress Notes (Signed)
Discharge instructions given, verbalized understanding, out in stable condition ambulatory with staff. 

## 2014-10-11 NOTE — Care Management Note (Signed)
Case Management Note  Patient Details  Name: Travis Silva MRN: [id number] Date of Birth: 15-Sep-1965  Expected Discharge Date:                  Expected Discharge Plan:  Home/Self Care  In-House Referral:  NA  Discharge planning Services  CM Consult  Post Acute Care Choice:  NA Choice offered to:  NA  DME Arranged:    DME Agency:     HH Arranged:    HH Agency:     Status of Service:  Completed, signed off  Medicare Important Message Given:    Date Medicare IM Given:    Medicare IM give by:    Date Additional Medicare IM Given:    Additional Medicare Important Message give by:     If discussed at Long Length of Stay Meetings, dates discussed:    Additional Comments: Pt is from home, independent with ADL's. Pt has no HH services or DME needs prior to admission. Pt plans to discharge home with self care today. No CM needs.  Malcolm Metro, RN 10/11/2014, 1:41 PM

## 2014-10-11 NOTE — Progress Notes (Signed)
Disregard purple travel/ebola warning. This is mistakenly listed. No need for concern. Call if questions.  Duke Salvia Drue Second MD MPH [facility] [phone]

## 2014-10-11 NOTE — Consult Note (Signed)
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 He also is a former smoker and has a family history of CAD. Two days ago he became upset at work and had SSCP. Yesterday he again became angry at work and had SSCP associated with weakness, headache, and nausea. He denies any diaphoresis of radiation to his jaw or arms. He walks 3 miles 3 x week and has no chest pain or unusual dyspnea.    Problem List: Past Medical History  Diagnosis Date  . PTSD (post-traumatic stress disorder)   . Diabetes mellitus without complication   . Hyperlipidemia   . Hypertension   . GERD (gastroesophageal reflux disease)   . Kidney stone   . Sleep apnea     on C-pap    Past Surgical History  Procedure Laterality Date  . Appendectomy    . Knee surgery       Allergies:  Allergies  Allergen Reactions  . Penicillins Anaphylaxis  . Ace Inhibitors Cough     Home Medications Current Facility-Administered Medications  Medication Dose Route Frequency Provider Last Rate Last Dose  . acetaminophen (TYLENOL) tablet 650 mg  650 mg Oral Q4H PRN Nimish C Gosrani, MD      . albuterol (PROVENTIL HFA;VENTOLIN HFA) 108 (90 BASE) MCG/ACT inhaler 2 puff  2 puff Inhalation Q6H PRN Nimish C Gosrani, MD      . aspirin EC tablet 81 mg  81 mg Oral Daily Standley Brooking, MD   81 mg at 10/11/14 0840  . atorvastatin (LIPITOR) tablet 80 mg  80 mg Oral Daily Nimish C Karilyn Cota, MD   80 mg at 10/11/14 0840  . divalproex (DEPAKOTE) DR tablet 500 mg  500 mg Oral BID Wilson Singer, MD   500 mg at 10/11/14 0840  . enoxaparin (LOVENOX) injection 40 mg  40 mg Subcutaneous Q24H Nimish C  Karilyn Cota, MD   40 mg at 10/10/14 2129  . fluticasone (FLONASE) 50 MCG/ACT nasal spray 2 spray  2 spray Each Nare Daily Wilson Singer, MD   2 spray at 10/11/14 0841  . glipiZIDE (GLUCOTROL) tablet 5 mg  5 mg Oral QAC breakfast Wilson Singer, MD   5 mg at 10/11/14 0839  . insulin aspart (novoLOG) injection 0-20 Units  0-20 Units Subcutaneous TID WC Wilson Singer, MD   0 Units at 10/11/14 0841  . insulin aspart (novoLOG) injection 0-5 Units  0-5 Units Subcutaneous QHS Wilson Singer, MD   0 Units at 10/10/14 2200  . losartan (COZAAR) tablet 50 mg  50 mg Oral Daily Wilson Singer, MD   50 mg at 10/11/14 0839  . lurasidone (LATUDA) tablet 80 mg  80 mg Oral QHS Standley Brooking, MD      . metFORMIN (GLUCOPHAGE) tablet 500 mg  500 mg Oral BID WC Nimish Normajean Glasgow, MD   500 mg at 10/11/14 0840  . ondansetron (ZOFRAN) injection 4 mg  4 mg Intravenous Q6H PRN Nimish C Gosrani, MD      . tamsulosin (FLOMAX) capsule 0.4 mg  0.4 mg Oral QPC supper Wilson Singer, MD   0.4 mg at 10/10/14 2128  Family History  Problem Relation Age of Onset  . Diabetes Father   . Coronary artery disease Father     MI in 35s     History   Social History  . Marital Status: Married    Spouse Name: N/A  . Number of Children: N/A  . Years of Education: N/A   Occupational History  . Not on file.   Social History Main Topics  . Smoking status: Former Smoker    Types: Cigarettes    Quit date: 06/28/2011  . Smokeless tobacco: Current User  . Alcohol Use: No  . Drug Use: No  . Sexual Activity: Not on file   Other Topics Concern  . Not on file   Social History Narrative     Review of Systems: See H&P for complete ROS. He says he has lost 27 lbs in an effort to loose wgt.   Physical Exam: Blood pressure 121/59, pulse 88, temperature 98.1 F (36.7 C), temperature source Oral, resp. rate 20, height  (1.854 m), weight 300 lb 14.4 oz (136.487 kg), SpO2 96 %.  General appearance: alert,  cooperative, no distress and moderately obese Neck: no carotid bruit and no JVD Lungs: clear to auscultation bilaterally Heart: regular rate and rhythm Abdomen: soft, non-tender; bowel sounds normal; no masses,  no organomegaly Extremities: extremities normal, atraumatic, no cyanosis or edema Pulses: 2+ and symmetric Skin: Skin color, texture, turgor normal. No rashes or lesions Neurologic: Grossly normal    Labs:   Results for orders placed or performed during the hospital encounter of 10/10/14 (from the past 24 hour(s))  CBC with Differential     Status: None   Collection Time: 10/10/14 11:30 AM  Result Value Ref Range   WBC 8.0 4.0 - 10.5 K/uL   RBC 4.85 4.22 - 5.81 MIL/uL   Hemoglobin 14.3 13.0 - 17.0 g/dL   HCT 16.1 09.6 - 04.5 %   MCV 88.2 78.0 - 100.0 fL   MCH 29.5 26.0 - 34.0 pg   MCHC 33.4 30.0 - 36.0 g/dL   RDW 40.9 81.1 - 91.4 %   Platelets 269 150 - 400 K/uL   Neutrophils Relative % 43 43 - 77 %   Neutro Abs 3.4 1.7 - 7.7 K/uL   Lymphocytes Relative 45 12 - 46 %   Lymphs Abs 3.6 0.7 - 4.0 K/uL   Monocytes Relative 10 3 - 12 %   Monocytes Absolute 0.8 0.1 - 1.0 K/uL   Eosinophils Relative 3 0 - 5 %   Eosinophils Absolute 0.2 0.0 - 0.7 K/uL   Basophils Relative 1 0 - 1 %   Basophils Absolute 0.0 0.0 - 0.1 K/uL  Troponin I     Status: None   Collection Time: 10/10/14 11:30 AM  Result Value Ref Range   Troponin I <0.03 <0.031 ng/mL  Comprehensive metabolic panel     Status: Abnormal   Collection Time: 10/10/14 11:30 AM  Result Value Ref Range   Sodium 139 135 - 145 mmol/L   Potassium 4.0 3.5 - 5.1 mmol/L   Chloride 105 101 - 111 mmol/L   CO2 25 22 - 32 mmol/L   Glucose, Bld 108 (H) 65 - 99 mg/dL   BUN 15 6 - 20 mg/dL   Creatinine, Ser 7.82 0.61 - 1.24 mg/dL   Calcium 9.0 8.9 - 95.6 mg/dL   Total Protein 6.8 6.5 - 8.1 g/dL   Albumin 4.1 3.5 - 5.0 g/dL   AST 37 15 -  41 U/L   ALT 45 17 - 63 U/L   Alkaline Phosphatase 54 38 - 126 U/L   Total Bilirubin 1.0  0.3 - 1.2 mg/dL   GFR calc non Af Amer >60 >60 mL/min   GFR calc Af Amer >60 >60 mL/min   Anion gap 9 5 - 15  Troponin I     Status: None   Collection Time: 10/10/14  3:21 PM  Result Value Ref Range   Troponin I <0.03 <0.031 ng/mL  Troponin I-serum (0, 3, 6 hours)     Status: None   Collection Time: 10/10/14  7:45 PM  Result Value Ref Range   Troponin I <0.03 <0.031 ng/mL  Glucose, capillary     Status: None   Collection Time: 10/10/14  9:23 PM  Result Value Ref Range   Glucose-Capillary 89 65 - 99 mg/dL   Comment 1 Notify RN    Comment 2 Document in Chart   Troponin I-serum (0, 3, 6 hours)     Status: None   Collection Time: 10/10/14 10:30 PM  Result Value Ref Range   Troponin I <0.03 <0.031 ng/mL  Troponin I-serum (0, 3, 6 hours)     Status: None   Collection Time: 10/11/14  1:34 AM  Result Value Ref Range   Troponin I <0.03 <0.031 ng/mL  Glucose, capillary     Status: Abnormal   Collection Time: 10/11/14  7:40 AM  Result Value Ref Range   Glucose-Capillary 113 (H) 65 - 99 mg/dL   Comment 1 Notify RN      Radiology/Studies: Ct Angio Head W/cm &/or Wo Cm  10/10/2014   CLINICAL DATA:  Severe headache.  Acute onset this morning.  EXAM: CT ANGIOGRAPHY HEAD AND NECK  TECHNIQUE: Multidetector CT imaging of the head and neck was performed using the standard protocol during bolus administration of intravenous contrast. Multiplanar CT image reconstructions and MIPs were obtained to evaluate the vascular anatomy. Carotid stenosis measurements (when applicable) are obtained utilizing NASCET criteria, using the distal internal carotid diameter as the denominator.  CONTRAST:  75mL OMNIPAQUE IOHEXOL 350 MG/ML SOLN  COMPARISON:  None.  FINDINGS: CT HEAD  The brain has a normal appearance without evidence of atrophy, infarction, mass lesion, hemorrhage, hydrocephalus or extra-axial collection. The calvarium is unremarkable. The paranasal sinuses, middle ears and mastoids are clear.  CTA NECK   Aortic arch: No evidence of atherosclerosis, dissection or aneurysm. Branching pattern of the brachiocephalic vessels from the arch is normal.  Right carotid system: Common carotid artery widely patent to the bifurcation. Minimal atherosclerotic calcification at the bifurcation but no stenosis or irregularity. Cervical internal carotid artery is normal.  Left carotid system: Left common carotid artery widely patent to the bifurcation. Minimal atherosclerotic calcification of the bifurcation but no narrowing or irregularity. Cervical internal carotid artery is normal.  Vertebral arteries:Both vertebral arteries approximately equal in size. Origins not well seen because of shoulder density. No stenosis suspected. Vessels appear widely patent in the upper neck.  Skeleton: Ordinary cervical spondylosis  Other neck: No significant finding.  Lung apices are clear.  CTA HEAD  Anterior circulation: Both internal carotid arteries are widely patent through the siphon region. The anterior and middle cerebral vessels are normal without proximal stenosis, aneurysm or vascular malformation.  Posterior circulation: Both vertebral arteries are patent to the basilar. No basilar stenosis. Posterior circulation branch vessels are normal.  Venous sinuses: Patent and normal  Anatomic variants: None significant  Delayed phase: No abnormal enhancement  IMPRESSION: No evidence of intracranial hemorrhage or other imaging abnormality to explain headache.  Normal CT angiography with the exception of minimal, non stenotic atherosclerotic calcification at the carotid bifurcations.   Electronically Signed   By: Paulina Fusi M.D.   On: 10/10/2014 15:23   Dg Chest 2 View  10/10/2014   CLINICAL DATA:  Chest pain, headache  EXAM: CHEST  2 VIEW  COMPARISON:  None.  FINDINGS: The heart size and mediastinal contours are within normal limits. Both lungs are clear. Mild degenerative changes lower thoracic spine.  IMPRESSION: No active cardiopulmonary  disease.   Electronically Signed   By: Natasha Mead M.D.   On: 10/10/2014 12:49   Ct Angio Neck W/cm &/or Wo/cm  10/10/2014   CLINICAL DATA:  Severe headache.  Acute onset this morning.  EXAM: CT ANGIOGRAPHY HEAD AND NECK  TECHNIQUE: Multidetector CT imaging of the head and neck was performed using the standard protocol during bolus administration of intravenous contrast. Multiplanar CT image reconstructions and MIPs were obtained to evaluate the vascular anatomy. Carotid stenosis measurements (when applicable) are obtained utilizing NASCET criteria, using the distal internal carotid diameter as the denominator.  CONTRAST:  75mL OMNIPAQUE IOHEXOL 350 MG/ML SOLN  COMPARISON:  None.  FINDINGS: CT HEAD  The brain has a normal appearance without evidence of atrophy, infarction, mass lesion, hemorrhage, hydrocephalus or extra-axial collection. The calvarium is unremarkable. The paranasal sinuses, middle ears and mastoids are clear.  CTA NECK  Aortic arch: No evidence of atherosclerosis, dissection or aneurysm. Branching pattern of the brachiocephalic vessels from the arch is normal.  Right carotid system: Common carotid artery widely patent to the bifurcation. Minimal atherosclerotic calcification at the bifurcation but no stenosis or irregularity. Cervical internal carotid artery is normal.  Left carotid system: Left common carotid artery widely patent to the bifurcation. Minimal atherosclerotic calcification of the bifurcation but no narrowing or irregularity. Cervical internal carotid artery is normal.  Vertebral arteries:Both vertebral arteries approximately equal in size. Origins not well seen because of shoulder density. No stenosis suspected. Vessels appear widely patent in the upper neck.  Skeleton: Ordinary cervical spondylosis  Other neck: No significant finding.  Lung apices are clear.  CTA HEAD  Anterior circulation: Both internal carotid arteries are widely patent through the siphon region. The anterior and  middle cerebral vessels are normal without proximal stenosis, aneurysm or vascular malformation.  Posterior circulation: Both vertebral arteries are patent to the basilar. No basilar stenosis. Posterior circulation branch vessels are normal.  Venous sinuses: Patent and normal  Anatomic variants: None significant  Delayed phase: No abnormal enhancement  IMPRESSION: No evidence of intracranial hemorrhage or other imaging abnormality to explain headache.  Normal CT angiography with the exception of minimal, non stenotic atherosclerotic calcification at the carotid bifurcations.   Electronically Signed   By: Paulina Fusi M.D.   On: 10/10/2014 15:23    EKG:NSR without acute changes  ASSESSMENT AND PLAN:  Principal Problem:   Chest pain with moderate risk of acute coronary syndrome Active Problems:   Diabetes   Essential hypertension   Obesity   PTSD (post-traumatic stress disorder)   Hyperlipidemia   Sleep apnea   Headache   PLAN: Will review with MD. He has multiple risk factors for CAD but no exertional symptoms (symptoms associated with stress and anger). His Troponin's are negative and EKG without acute changes. He has eaten breakfast today- ? OP GXT Myoview. Add ASA 81 mg.    Deland Pretty, PA-C 10/11/2014,  9:18 AM [phone]  The patient was seen and examined, and I agree with the assessment and plan as documented above, with modifications as noted below. 49 yr old male with multiple cardiovascular risk factors admitted with chest pain who has since ruled out for an acute coronary syndrome. ECG and chest xray normal. Had a profound headache, head CT without acute abnormalities.  Had 2 episodes of chest pain in past two days triggered by anger outbursts at work. Has had similar episodes prior to this also associated with anger for which he did not seek evaluation.  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  Has eaten breakfast. I will arrange for an outpatient exercise Cardiolite stress test and follow up with me afterwards.  He can be discharged from my standpoint.

## 2014-10-11 NOTE — Plan of Care (Signed)
Problem: Phase I Progression Outcomes Goal: Hemodynamically stable Outcome: Progressing See flowsheet Goal: Anginal pain relieved Outcome: Progressing Pt denies pain

## 2014-10-11 NOTE — Discharge Summary (Signed)
Physician Discharge Summary  Travis Silva ZOX:[id number] DOB: [birth year] DOA: 10/10/2014  PCP: Bennie Pierini, FNP  Admit date: 10/10/2014 Discharge date: 10/11/2014  Time spent:  minutes  Recommendations for Outpatient Follow-up:  1. Follow up with Joni Reining for evaluation chest pain OP stress test 2.   Discharge Diagnoses:  Principal Problem:   Chest pain with moderate risk of acute coronary syndrome Active Problems:   Headache   Diabetes   Obesity   Essential hypertension   PTSD (post-traumatic stress disorder)   Hyperlipidemia   Sleep apnea   Discharge Condition: stable  Diet recommendation: carb modified heart healthy  Filed Weights   10/10/14 1101 10/10/14 2002  Weight: 136.079 kg (300 lb) 136.487 kg (300 lb 14.4 oz)    History of present illness:  Travis Silva is a 49 y.o. male pmhx, diabetic, obese, hyperlipidemia who presented to ED on 10/10/14 with chest tightness  which started at 9:30 AM day of presentation.  It lasted approximately one half hours and when he into the emergency room, it had eased off. He reports being extemely stressed at work and managed to calm down. The morning of presentation he began to have the chest tightness together with headache. The chest tightness did not radiate and was not associated with dyspnea, nausea, sweating. The headache was not associated with photophobia. All his symptoms seemed to be improving at time of admission. However given his multiple risk factors for coronary artery disease including  father who had myocardial infarction at the age of 25 he was admitted for rule out.    Hospital Course:  1. Chest tightness/pain. Atypical likely related to stress and anger. Troponin negative x3, EKG without acute changes, no events on tele. No further episodes of CP. Chest xray unremarkable.  Heart score 3-4.  Evaluated by cardiology who opine 2 episodes of chest pain in last 2 days triggered by anger outbursts and   recommended continuing asa and statin and OP exercise Cardiolite stress test with follow up with Dr Darl Householder afterwards 2. Diabetes. CBG 58-113.  3. Headache. Likely related to stress. CT head no acute changes 4. Obesity: BMI 39.8 nutritional counseling 5. HTN: fair control   Procedures:    Consultations:  cardiology  Discharge Exam: Filed Vitals:   10/11/14 0619  BP: 121/59  Pulse: 88  Temp: 98.1 F (36.7 C)  Resp: 20    General: sitting up in bed appears well Cardiovascular: RRR no MGR No LE edema Respiratory: normal effort BS clear bilaterally no wheeze  Discharge Instructions    Current Discharge Medication List    CONTINUE these medications which have CHANGED   Details  tadalafil (CIALIS) 5 MG tablet Take 1 tablet (5 mg total) by mouth daily as needed for erectile dysfunction. 1 po qd for BPH Qty: 30 tablet, Refills: 2   Associated Diagnoses: BPH (benign prostatic hyperplasia)      CONTINUE these medications which have NOT CHANGED   Details  albuterol (PROVENTIL HFA;VENTOLIN HFA) 108 (90 BASE) MCG/ACT inhaler Inhale 2 puffs into the lungs every 6 (six) hours as needed for wheezing or shortness of breath. Qty: 1 Inhaler, Refills: 2    aspirin 81 MG tablet Take 81 mg by mouth daily.    atorvastatin (LIPITOR) 80 MG tablet Take 80 mg by mouth daily.    divalproex (DEPAKOTE) 500 MG DR tablet Take 500 mg by mouth 2 (two) times daily.    fluticasone (FLONASE) 50 MCG/ACT nasal spray Place 2 sprays into both nostrils daily.  Qty: 16 g, Refills: 6    glipiZIDE (GLUCOTROL) 5 MG tablet Take 5 mg by mouth daily before breakfast.     losartan (COZAAR) 50 MG tablet Take 1 tablet (50 mg total) by mouth daily. Qty: 90 tablet, Refills: 1   Associated Diagnoses: Essential hypertension    lurasidone (LATUDA) 80 MG TABS tablet Take 80 mg by mouth daily with breakfast.    metFORMIN (GLUCOPHAGE) 500 MG tablet Take 500 mg by mouth 2 (two) times daily with a meal.      tamsulosin (FLOMAX) 0.4 MG CAPS capsule Take 0.4 mg by mouth.    Venlafaxine HCl 150 MG TB24 Take 150 mg by mouth daily.      STOP taking these medications     cetirizine (ZYRTEC) 10 MG tablet        Allergies  Allergen Reactions  . Penicillins Anaphylaxis  . Ace Inhibitors Cough   Follow-up Information    Follow up with [facility] On 10/22/2014.   Why:  Register at 8:45 am for Stress Test (Radiology Dept).  Do not eat or drink after midnight.   Contact information:   [phone]      Follow up with Joni Reining, NP On 10/26/2014.   Specialties:  Nurse Practitioner, Radiology, Cardiology   Why:  at 1:50 pm   Contact information:   [address] [place] [address] [phone]        The results of significant diagnostics from this hospitalization (including imaging, microbiology, ancillary and laboratory) are listed below for reference.    Significant Diagnostic Studies: Ct Angio Head W/cm &/or Wo Cm  10/10/2014   CLINICAL DATA:  Severe headache.  Acute onset this morning.  EXAM: CT ANGIOGRAPHY HEAD AND NECK  TECHNIQUE: Multidetector CT imaging of the head and neck was performed using the standard protocol during bolus administration of intravenous contrast. Multiplanar CT image reconstructions and MIPs were obtained to evaluate the vascular anatomy. Carotid stenosis measurements (when applicable) are obtained utilizing NASCET criteria, using the distal internal carotid diameter as the denominator.  CONTRAST:  75mL OMNIPAQUE IOHEXOL 350 MG/ML SOLN  COMPARISON:  None.  FINDINGS: CT HEAD  The brain has a normal appearance without evidence of atrophy, infarction, mass lesion, hemorrhage, hydrocephalus or extra-axial collection. The calvarium is unremarkable. The paranasal sinuses, middle ears and mastoids are clear.  CTA NECK  Aortic arch: No evidence of atherosclerosis, dissection or aneurysm. Branching pattern of the brachiocephalic vessels from the arch is normal.   Right carotid system: Common carotid artery widely patent to the bifurcation. Minimal atherosclerotic calcification at the bifurcation but no stenosis or irregularity. Cervical internal carotid artery is normal.  Left carotid system: Left common carotid artery widely patent to the bifurcation. Minimal atherosclerotic calcification of the bifurcation but no narrowing or irregularity. Cervical internal carotid artery is normal.  Vertebral arteries:Both vertebral arteries approximately equal in size. Origins not well seen because of shoulder density. No stenosis suspected. Vessels appear widely patent in the upper neck.  Skeleton: Ordinary cervical spondylosis  Other neck: No significant finding.  Lung apices are clear.  CTA HEAD  Anterior circulation: Both internal carotid arteries are widely patent through the siphon region. The anterior and middle cerebral vessels are normal without proximal stenosis, aneurysm or vascular malformation.  Posterior circulation: Both vertebral arteries are patent to the basilar. No basilar stenosis. Posterior circulation branch vessels are normal.  Venous sinuses: Patent and normal  Anatomic variants: None significant  Delayed phase: No abnormal enhancement  IMPRESSION: No evidence of intracranial hemorrhage or other imaging abnormality to explain headache.  Normal CT angiography with the exception of minimal, non stenotic atherosclerotic calcification at the carotid bifurcations.   Electronically Signed   By: Paulina Fusi M.D.   On: 10/10/2014 15:23   Dg Chest 2 View  10/10/2014   CLINICAL DATA:  Chest pain, headache  EXAM: CHEST  2 VIEW  COMPARISON:  None.  FINDINGS: The heart size and mediastinal contours are within normal limits. Both lungs are clear. Mild degenerative changes lower thoracic spine.  IMPRESSION: No active cardiopulmonary disease.   Electronically Signed   By: Natasha Mead M.D.   On: 10/10/2014 12:49   Ct Angio Neck W/cm &/or Wo/cm  10/10/2014   CLINICAL DATA:   Severe headache.  Acute onset this morning.  EXAM: CT ANGIOGRAPHY HEAD AND NECK  TECHNIQUE: Multidetector CT imaging of the head and neck was performed using the standard protocol during bolus administration of intravenous contrast. Multiplanar CT image reconstructions and MIPs were obtained to evaluate the vascular anatomy. Carotid stenosis measurements (when applicable) are obtained utilizing NASCET criteria, using the distal internal carotid diameter as the denominator.  CONTRAST:  75mL OMNIPAQUE IOHEXOL 350 MG/ML SOLN  COMPARISON:  None.  FINDINGS: CT HEAD  The brain has a normal appearance without evidence of atrophy, infarction, mass lesion, hemorrhage, hydrocephalus or extra-axial collection. The calvarium is unremarkable. The paranasal sinuses, middle ears and mastoids are clear.  CTA NECK  Aortic arch: No evidence of atherosclerosis, dissection or aneurysm. Branching pattern of the brachiocephalic vessels from the arch is normal.  Right carotid system: Common carotid artery widely patent to the bifurcation. Minimal atherosclerotic calcification at the bifurcation but no stenosis or irregularity. Cervical internal carotid artery is normal.  Left carotid system: Left common carotid artery widely patent to the bifurcation. Minimal atherosclerotic calcification of the bifurcation but no narrowing or irregularity. Cervical internal carotid artery is normal.  Vertebral arteries:Both vertebral arteries approximately equal in size. Origins not well seen because of shoulder density. No stenosis suspected. Vessels appear widely patent in the upper neck.  Skeleton: Ordinary cervical spondylosis  Other neck: No significant finding.  Lung apices are clear.  CTA HEAD  Anterior circulation: Both internal carotid arteries are widely patent through the siphon region. The anterior and middle cerebral vessels are normal without proximal stenosis, aneurysm or vascular malformation.  Posterior circulation: Both vertebral  arteries are patent to the basilar. No basilar stenosis. Posterior circulation branch vessels are normal.  Venous sinuses: Patent and normal  Anatomic variants: None significant  Delayed phase: No abnormal enhancement  IMPRESSION: No evidence of intracranial hemorrhage or other imaging abnormality to explain headache.  Normal CT angiography with the exception of minimal, non stenotic atherosclerotic calcification at the carotid bifurcations.   Electronically Signed   By: Paulina Fusi M.D.   On: 10/10/2014 15:23    Microbiology: No results found for this or any previous visit (from the past 240 hour(s)).   Labs: Basic Metabolic Panel:  Recent Labs Lab 10/10/14 1130  NA 139  K 4.0  CL 105  CO2 25  GLUCOSE 108*  BUN 15  CREATININE 0.94  CALCIUM 9.0   Liver Function Tests:  Recent Labs Lab 10/10/14 1130  AST 37  ALT 45  ALKPHOS 54  BILITOT 1.0  PROT 6.8  ALBUMIN 4.1   No results for input(s): LIPASE, AMYLASE in the last 168 hours. No results for input(s): AMMONIA in the last 168 hours. CBC:  Recent Labs Lab 10/10/14 1130  WBC 8.0  NEUTROABS 3.4  HGB 14.3  HCT 42.8  MCV 88.2  PLT 269   Cardiac Enzymes:  Recent Labs Lab 10/10/14 1130 10/10/14 1521 10/10/14 1945 10/10/14 2230 10/11/14 0134  TROPONINI <0.03 <0.03 <0.03 <0.03 <0.03   BNP: BNP (last 3 results) No results for input(s): BNP in the last 8760 hours.  ProBNP (last 3 results) No results for input(s): PROBNP in the last 8760 hours.  CBG:  Recent Labs Lab 10/10/14 2123 10/11/14 0740 10/11/14 1133 10/11/14 1203  GLUCAP 89 113* 58* 77       Signed:  BLACK,KAREN M  [facility] 10/11/2014, 12:17 PM

## 2014-10-18 ENCOUNTER — Other Ambulatory Visit: Payer: Self-pay | Admitting: Cardiology

## 2014-10-18 DIAGNOSIS — R079 Chest pain, unspecified: Secondary | ICD-10-CM

## 2014-10-22 ENCOUNTER — Encounter (HOSPITAL_COMMUNITY)
Admission: RE | Admit: 2014-10-22 | Discharge: 2014-10-22 | Disposition: A | Discharge status: Home / Self Care | Source: Ambulatory Visit | Attending: Cardiology | Admitting: Cardiology

## 2014-10-22 ENCOUNTER — Inpatient Hospital Stay (HOSPITAL_COMMUNITY): Admit: 2014-10-22

## 2014-10-22 ENCOUNTER — Encounter (HOSPITAL_COMMUNITY): Payer: Self-pay

## 2014-10-22 DIAGNOSIS — I998 Other disorder of circulatory system: Secondary | ICD-10-CM | POA: Diagnosis not present

## 2014-10-22 DIAGNOSIS — R079 Chest pain, unspecified: Secondary | ICD-10-CM | POA: Diagnosis present

## 2014-10-22 DIAGNOSIS — N200 Calculus of kidney: Secondary | ICD-10-CM | POA: Diagnosis not present

## 2014-10-22 DIAGNOSIS — G4730 Sleep apnea, unspecified: Secondary | ICD-10-CM | POA: Insufficient documentation

## 2014-10-22 DIAGNOSIS — Z9981 Dependence on supplemental oxygen: Secondary | ICD-10-CM | POA: Insufficient documentation

## 2014-10-22 DIAGNOSIS — K219 Gastro-esophageal reflux disease without esophagitis: Secondary | ICD-10-CM | POA: Diagnosis not present

## 2014-10-22 DIAGNOSIS — E119 Type 2 diabetes mellitus without complications: Secondary | ICD-10-CM | POA: Diagnosis not present

## 2014-10-22 DIAGNOSIS — E785 Hyperlipidemia, unspecified: Secondary | ICD-10-CM | POA: Diagnosis not present

## 2014-10-22 DIAGNOSIS — I10 Essential (primary) hypertension: Secondary | ICD-10-CM | POA: Insufficient documentation

## 2014-10-22 DIAGNOSIS — R931 Abnormal findings on diagnostic imaging of heart and coronary circulation: Secondary | ICD-10-CM | POA: Insufficient documentation

## 2014-10-22 LAB — NM MYOCAR MULTI W/SPECT W/WALL MOTION / EF
Estimated workload: 10.4 METS
Exercise duration (min): 7 min
Exercise duration (sec): 36 s
LV dias vol: 56 mL
LV sys vol: 15 mL
MPHR: 172 {beats}/min
Peak HR: 166 {beats}/min
Percent HR: 96 %
RATE: 0.4
RPE: 13
Rest HR: 82 {beats}/min
SDS: 7
SRS: 4
SSS: 8
TID: 0.64

## 2014-10-22 MED ORDER — SODIUM CHLORIDE 0.9 % IJ SOLN
INTRAMUSCULAR | Status: AC
  Administered 2014-10-22: 10 mL via INTRAVENOUS
  Filled 2014-10-22: qty 3

## 2014-10-22 MED ORDER — TECHNETIUM TC 99M SESTAMIBI - CARDIOLITE
10.0000 | Freq: Once | INTRAVENOUS | Status: AC | PRN
  Administered 2014-10-22: 09:00:00 9 via INTRAVENOUS

## 2014-10-22 MED ORDER — TECHNETIUM TC 99M SESTAMIBI GENERIC - CARDIOLITE
30.0000 | Freq: Once | INTRAVENOUS | Status: AC | PRN
  Administered 2014-10-22: 29 via INTRAVENOUS

## 2014-10-26 ENCOUNTER — Encounter: Payer: Self-pay | Admitting: Adult Health

## 2014-10-26 ENCOUNTER — Ambulatory Visit (INDEPENDENT_AMBULATORY_CARE_PROVIDER_SITE_OTHER): Admitting: Adult Health

## 2014-10-26 VITALS — BP 126/82 | HR 91 | Ht 73.0 in | Wt 297.0 lb

## 2014-10-26 DIAGNOSIS — R0789 Other chest pain: Secondary | ICD-10-CM | POA: Diagnosis not present

## 2014-10-26 DIAGNOSIS — R9439 Abnormal result of other cardiovascular function study: Secondary | ICD-10-CM

## 2014-10-26 NOTE — Progress Notes (Signed)
Cardiology Office Note   Date:  10/26/2014   ID:  Travis Silva, DOB [birth year], MRN [id number]  PCP:  Bennie Pierini, FNP  Cardiologist: Inis Sizer, NP   No chief complaint on file.     History of Present Illness: Travis Silva is a 49 y.o. male who presents for SSCP with history of HTN, PTSD, DM (diagnesed last year), obesity, sleep apnea on CPAP. Stress test was ordered. Completed on 10/22/2014.    There was no ST segment deviation noted during stress. Duke treadmill score of 8 consistent with low risk for major cardiac events  Findings consistent with prior myocardial infarction with peri-infarct ischemia. Small area of ischemia in the inferolateral wall, overall low risk finding especially combined with exercise results.  This is a low risk study.  The left ventricular ejection fraction is hyperdynamic (>65%).  He comes today asymptomatic. 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 He denies any recurrent chest pain since quitting his job. In fact, he states he feels better. He denies any dyspnea on exertion or fatigue. He is losing weight and exercising more. He is medically compliant. His medications, from the [place].  Past Medical History  Diagnosis Date  . PTSD (post-traumatic stress disorder)   . Diabetes mellitus without complication   . Hyperlipidemia   . Hypertension   . GERD (gastroesophageal reflux disease)   . Kidney stone   . Sleep apnea     on C-pap    Past Surgical History  Procedure Laterality Date  . Appendectomy    . Knee surgery       Current Outpatient Prescriptions  Medication Sig Dispense Refill  . albuterol (PROVENTIL HFA;VENTOLIN HFA) 108 (90 BASE) MCG/ACT inhaler Inhale 2 puffs into the lungs every 6 (six) hours as needed for wheezing or shortness of breath. 1 Inhaler 2  . aspirin 81 MG tablet Take 81 mg by mouth daily.    Marland Kitchen atorvastatin (LIPITOR) 80 MG tablet  Take 80 mg by mouth daily.    . divalproex (DEPAKOTE) 500 MG DR tablet Take 500 mg by mouth 2 (two) times daily.    . fluticasone (FLONASE) 50 MCG/ACT nasal spray Place 2 sprays into both nostrils daily. 16 g 6  . glipiZIDE (GLUCOTROL) 5 MG tablet Take 5 mg by mouth daily before breakfast.     . losartan (COZAAR) 50 MG tablet Take 1 tablet (50 mg total) by mouth daily. 90 tablet 1  . lurasidone (LATUDA) 40 MG TABS tablet Take 40 mg by mouth daily with breakfast.    . metFORMIN (GLUCOPHAGE) 500 MG tablet Take 500 mg by mouth 2 (two) times daily with a meal.     . tamsulosin (FLOMAX) 0.4 MG CAPS capsule Take 0.4 mg by mouth.    . venlafaxine (EFFEXOR) 75 MG tablet Take 75 mg by mouth daily.     No current facility-administered medications for this visit.    Allergies:   Penicillins and Ace inhibitors    Social History:  The patient  reports that he quit smoking about 3 years ago. His smoking use included Cigarettes. His smokeless tobacco use includes Snuff. He reports that he does not drink alcohol or use illicit drugs.   Family History:  The patient's family history includes Coronary artery disease in his father; Diabetes in his father.    ROS: .   All other systems are reviewed and negative.Unless otherwise mentioned in H&P above.  PHYSICAL EXAM: VS:  BP 126/82 mmHg  Pulse 91  Ht  (1.854 m)  Wt 297 lb (134.718 kg)  BMI 39.19 kg/m2  SpO2 97% , BMI Body mass index is 39.19 kg/(m^2). GEN: Well nourished, well developed, in no acute distress HEENT: normal Neck: no JVD, carotid bruits, or masses Cardiac: RRR; no murmurs, rubs, or gallops,no edema  Respiratory:  clear to auscultation bilaterally, normal work of breathing GI: soft, nontender, nondistended, + BS MS: no deformity or atrophy Skin: warm and dry, no rash Neuro:  Strength and sensation are intact Psych: euthymic mood, full affect  Recent Labs: 10/10/2014: ALT 45; BUN 15; Creatinine, Ser 0.94; Hemoglobin 14.3;  Platelets 269; Potassium 4.0; Sodium 139    Lipid Panel No results found for: CHOL, TRIG, HDL, CHOLHDL, VLDL, LDLCALC, LDLDIRECT    Wt Readings from Last 3 Encounters:  10/26/14 297 lb (134.718 kg)  10/10/14 300 lb 14.4 oz (136.487 kg)  08/27/14 317 lb (143.79 kg)     ASSESSMENT AND PLAN:  1. Chest Pain: Found to have low risk MPI. I have given him a copy of the test results. I have explained to him that there was a mild abnormality that was suggestive of a small area of ischemia in the inferior/lateral wall. The overall low risk study. I suggested that if he continued be symptomatic, we can consider cardiac catheterization that I do not feel that we need to proceed unless he continues to be symptomatic. We have discussed this at length. He has chosen to not proceed at this time.  Since quitting his job, which was very high stress, he has not had any further symptoms. I have explained to him if he has recurrent symptoms, we would need to proceed with a catheterization. There was no mention of his cholesterol status.   2. Diabetes: He is to continue weight loss and exercise, monitor blood sugar.   3. Obesity: He should continue low calorie, wt loss diet.      Current medicines are reviewed at length with the patient today.    Labs/ tests ordered today include: None No orders of the defined types were placed in this encounter.     Disposition:   FU with 6 months unless symptomatic.    Signed, Joni Reining, NP  10/26/2014 3:06 PM    [facility] [address] [place], [place] [address] Phone: [phone]; Fax: [phone]

## 2014-10-26 NOTE — Patient Instructions (Signed)
Your physician wants you to follow-up in: 1 Year with Harriet Pho, NP You will receive a reminder letter in the mail two months in advance. If you don't receive a letter, please call our office to schedule the follow-up appointment.  Your physician recommends that you continue on your current medications as directed. Please refer to the Current Medication list given to you today.  Thank you for choosing [facility]!

## 2014-10-26 NOTE — Progress Notes (Signed)
Name: Erez Mccallum    DOB: 12-Nov-1965  Age: 49 y.o.  MR#: [id number]       PCP:  Bennie Pierini, FNP      Insurance: Payor: TRICARE / Plan: TRICARE / Product Type: *No Product type* /   CC:   No chief complaint on file.   VS Filed Vitals:   10/26/14 1348  BP: 126/82  Pulse: 91  Height: 6\' 1"  (1.854 m)  Weight: 297 lb (134.718 kg)  SpO2: 97%    Weights Current Weight  10/26/14 297 lb (134.718 kg)  10/10/14 300 lb 14.4 oz (136.487 kg)  08/27/14 317 lb (143.79 kg)    Blood Pressure  BP Readings from Last 3 Encounters:  10/26/14 126/82  10/11/14 146/85  08/27/14 140/97     Admit date:  (Not on file) Last encounter with RMR:  Visit date not found   Allergy Penicillins and Ace inhibitors  Current Outpatient Prescriptions  Medication Sig Dispense Refill  . albuterol (PROVENTIL HFA;VENTOLIN HFA) 108 (90 BASE) MCG/ACT inhaler Inhale 2 puffs into the lungs every 6 (six) hours as needed for wheezing or shortness of breath. 1 Inhaler 2  . aspirin 81 MG tablet Take 81 mg by mouth daily.    Marland Kitchen atorvastatin (LIPITOR) 80 MG tablet Take 80 mg by mouth daily.    . divalproex (DEPAKOTE) 500 MG DR tablet Take 500 mg by mouth 2 (two) times daily.    . fluticasone (FLONASE) 50 MCG/ACT nasal spray Place 2 sprays into both nostrils daily. 16 g 6  . glipiZIDE (GLUCOTROL) 5 MG tablet Take 5 mg by mouth daily before breakfast.     . losartan (COZAAR) 50 MG tablet Take 1 tablet (50 mg total) by mouth daily. 90 tablet 1  . lurasidone (LATUDA) 40 MG TABS tablet Take 40 mg by mouth daily with breakfast.    . metFORMIN (GLUCOPHAGE) 500 MG tablet Take 500 mg by mouth 2 (two) times daily with a meal.     . tamsulosin (FLOMAX) 0.4 MG CAPS capsule Take 0.4 mg by mouth.    . venlafaxine (EFFEXOR) 75 MG tablet Take 75 mg by mouth daily.     No current facility-administered medications for this visit.    Discontinued Meds:    Medications Discontinued During This Encounter  Medication Reason   . Venlafaxine HCl 150 MG TB24 Error  . lurasidone (LATUDA) 80 MG TABS tablet Error  . tadalafil (CIALIS) 5 MG tablet Error    Patient Active Problem List   Diagnosis Date Noted  . Essential hypertension 10/11/2014  . PTSD (post-traumatic stress disorder)   . Hyperlipidemia   . Sleep apnea   . Chest pain with moderate risk of acute coronary syndrome 10/10/2014  . Headache 10/10/2014  . Diabetes 10/10/2014  . Obesity 10/10/2014  . Kidney stone     LABS    Component Value Date/Time   NA 139 10/10/2014 1130   NA 139 08/23/2014 2045   K 4.0 10/10/2014 1130   K 4.1 08/23/2014 2045   CL 105 10/10/2014 1130   CL 107 08/23/2014 2045   CO2 25 10/10/2014 1130   CO2 25 08/23/2014 2045   GLUCOSE 108* 10/10/2014 1130   GLUCOSE 137* 08/23/2014 2045   BUN 15 10/10/2014 1130   BUN 17 08/23/2014 2045   CREATININE 0.94 10/10/2014 1130   CREATININE 0.94 08/23/2014 2045   CALCIUM 9.0 10/10/2014 1130   CALCIUM 8.8* 08/23/2014 2045   GFRNONAA >60 10/10/2014 1130   GFRNONAA >60  08/23/2014 2045   GFRAA >60 10/10/2014 1130   GFRAA >60 08/23/2014 2045   CMP     Component Value Date/Time   NA 139 10/10/2014 1130   K 4.0 10/10/2014 1130   CL 105 10/10/2014 1130   CO2 25 10/10/2014 1130   GLUCOSE 108* 10/10/2014 1130   BUN 15 10/10/2014 1130   CREATININE 0.94 10/10/2014 1130   CALCIUM 9.0 10/10/2014 1130   PROT 6.8 10/10/2014 1130   ALBUMIN 4.1 10/10/2014 1130   AST 37 10/10/2014 1130   ALT 45 10/10/2014 1130   ALKPHOS 54 10/10/2014 1130   BILITOT 1.0 10/10/2014 1130   GFRNONAA >60 10/10/2014 1130   GFRAA >60 10/10/2014 1130       Component Value Date/Time   WBC 8.0 10/10/2014 1130   WBC 8.3 08/23/2014 2045   HGB 14.3 10/10/2014 1130   HGB 13.7 08/23/2014 2045   HCT 42.8 10/10/2014 1130   HCT 41.7 08/23/2014 2045   MCV 88.2 10/10/2014 1130   MCV 89.5 08/23/2014 2045    Lipid Panel  No results found for: CHOL, TRIG, HDL, CHOLHDL, VLDL, LDLCALC, LDLDIRECT  ABG No  results found for: PHART, PCO2ART, PO2ART, HCO3, TCO2, ACIDBASEDEF, O2SAT   No results found for: TSH BNP (last 3 results) No results for input(s): BNP in the last 8760 hours.  ProBNP (last 3 results) No results for input(s): PROBNP in the last 8760 hours.  Cardiac Panel (last 3 results) No results for input(s): CKTOTAL, CKMB, TROPONINI, RELINDX in the last 72 hours.  Iron/TIBC/Ferritin/ %Sat No results found for: IRON, TIBC, FERRITIN, IRONPCTSAT   EKG Orders placed or performed during the hospital encounter of 10/10/14  . ED EKG  . ED EKG  . EKG 12-Lead  . EKG 12-Lead  . EKG 12-Lead (at 6am)  . EKG 12-Lead (at 6am)  . EKG     Prior Assessment and Plan Problem List as of 10/26/2014      Cardiovascular and Mediastinum   Essential hypertension     Endocrine   Diabetes     Genitourinary   Kidney stone     Other   Chest pain with moderate risk of acute coronary syndrome   Headache   Obesity   PTSD (post-traumatic stress disorder)   Hyperlipidemia   Sleep apnea       Imaging: Ct Angio Head W/cm &/or Wo Cm  10/10/2014   CLINICAL DATA:  Severe headache.  Acute onset this morning.  EXAM: CT ANGIOGRAPHY HEAD AND NECK  TECHNIQUE: Multidetector CT imaging of the head and neck was performed using the standard protocol during bolus administration of intravenous contrast. Multiplanar CT image reconstructions and MIPs were obtained to evaluate the vascular anatomy. Carotid stenosis measurements (when applicable) are obtained utilizing NASCET criteria, using the distal internal carotid diameter as the denominator.  CONTRAST:  75mL OMNIPAQUE IOHEXOL 350 MG/ML SOLN  COMPARISON:  None.  FINDINGS: CT HEAD  The brain has a normal appearance without evidence of atrophy, infarction, mass lesion, hemorrhage, hydrocephalus or extra-axial collection. The calvarium is unremarkable. The paranasal sinuses, middle ears and mastoids are clear.  CTA NECK  Aortic arch: No evidence of atherosclerosis,  dissection or aneurysm. Branching pattern of the brachiocephalic vessels from the arch is normal.  Right carotid system: Common carotid artery widely patent to the bifurcation. Minimal atherosclerotic calcification at the bifurcation but no stenosis or irregularity. Cervical internal carotid artery is normal.  Left carotid system: Left common carotid artery widely patent to the bifurcation.  Minimal atherosclerotic calcification of the bifurcation but no narrowing or irregularity. Cervical internal carotid artery is normal.  Vertebral arteries:Both vertebral arteries approximately equal in size. Origins not well seen because of shoulder density. No stenosis suspected. Vessels appear widely patent in the upper neck.  Skeleton: Ordinary cervical spondylosis  Other neck: No significant finding.  Lung apices are clear.  CTA HEAD  Anterior circulation: Both internal carotid arteries are widely patent through the siphon region. The anterior and middle cerebral vessels are normal without proximal stenosis, aneurysm or vascular malformation.  Posterior circulation: Both vertebral arteries are patent to the basilar. No basilar stenosis. Posterior circulation branch vessels are normal.  Venous sinuses: Patent and normal  Anatomic variants: None significant  Delayed phase: No abnormal enhancement  IMPRESSION: No evidence of intracranial hemorrhage or other imaging abnormality to explain headache.  Normal CT angiography with the exception of minimal, non stenotic atherosclerotic calcification at the carotid bifurcations.   Electronically Signed   By: Paulina Fusi M.D.   On: 10/10/2014 15:23   Dg Chest 2 View  10/10/2014   CLINICAL DATA:  Chest pain, headache  EXAM: CHEST  2 VIEW  COMPARISON:  None.  FINDINGS: The heart size and mediastinal contours are within normal limits. Both lungs are clear. Mild degenerative changes lower thoracic spine.  IMPRESSION: No active cardiopulmonary disease.   Electronically Signed   By: Natasha Mead M.D.   On: 10/10/2014 12:49   Ct Angio Neck W/cm &/or Wo/cm  10/10/2014   CLINICAL DATA:  Severe headache.  Acute onset this morning.  EXAM: CT ANGIOGRAPHY HEAD AND NECK  TECHNIQUE: Multidetector CT imaging of the head and neck was performed using the standard protocol during bolus administration of intravenous contrast. Multiplanar CT image reconstructions and MIPs were obtained to evaluate the vascular anatomy. Carotid stenosis measurements (when applicable) are obtained utilizing NASCET criteria, using the distal internal carotid diameter as the denominator.  CONTRAST:  75mL OMNIPAQUE IOHEXOL 350 MG/ML SOLN  COMPARISON:  None.  FINDINGS: CT HEAD  The brain has a normal appearance without evidence of atrophy, infarction, mass lesion, hemorrhage, hydrocephalus or extra-axial collection. The calvarium is unremarkable. The paranasal sinuses, middle ears and mastoids are clear.  CTA NECK  Aortic arch: No evidence of atherosclerosis, dissection or aneurysm. Branching pattern of the brachiocephalic vessels from the arch is normal.  Right carotid system: Common carotid artery widely patent to the bifurcation. Minimal atherosclerotic calcification at the bifurcation but no stenosis or irregularity. Cervical internal carotid artery is normal.  Left carotid system: Left common carotid artery widely patent to the bifurcation. Minimal atherosclerotic calcification of the bifurcation but no narrowing or irregularity. Cervical internal carotid artery is normal.  Vertebral arteries:Both vertebral arteries approximately equal in size. Origins not well seen because of shoulder density. No stenosis suspected. Vessels appear widely patent in the upper neck.  Skeleton: Ordinary cervical spondylosis  Other neck: No significant finding.  Lung apices are clear.  CTA HEAD  Anterior circulation: Both internal carotid arteries are widely patent through the siphon region. The anterior and middle cerebral vessels are normal without  proximal stenosis, aneurysm or vascular malformation.  Posterior circulation: Both vertebral arteries are patent to the basilar. No basilar stenosis. Posterior circulation branch vessels are normal.  Venous sinuses: Patent and normal  Anatomic variants: None significant  Delayed phase: No abnormal enhancement  IMPRESSION: No evidence of intracranial hemorrhage or other imaging abnormality to explain headache.  Normal CT angiography with the exception of minimal,  non stenotic atherosclerotic calcification at the carotid bifurcations.   Electronically Signed   By: Paulina Fusi M.D.   On: 10/10/2014 15:23   Nm Myocar Multi W/spect W/wall Motion / Ef  10/22/2014    There was no ST segment deviation noted during stress. [facility] treadmill  score of 8 consistent with low risk for major cardiac events  Findings consistent with prior myocardial infarction with peri-infarct  ischemia. Small area of ischemia in the inferolateral wall, overall low  risk finding especially combined with exercise results.  This is a low risk study.  The left ventricular ejection fraction is hyperdynamic (>65%).

## 2015-05-01 ENCOUNTER — Encounter: Payer: Self-pay | Admitting: Physician Assistant

## 2015-05-01 ENCOUNTER — Ambulatory Visit (INDEPENDENT_AMBULATORY_CARE_PROVIDER_SITE_OTHER): Admitting: Physician Assistant

## 2015-05-01 VITALS — BP 118/76 | HR 89 | Wt 277.0 lb

## 2015-05-01 DIAGNOSIS — E669 Obesity, unspecified: Secondary | ICD-10-CM

## 2015-05-01 DIAGNOSIS — E785 Hyperlipidemia, unspecified: Secondary | ICD-10-CM | POA: Diagnosis not present

## 2015-05-01 DIAGNOSIS — I10 Essential (primary) hypertension: Secondary | ICD-10-CM

## 2015-05-01 DIAGNOSIS — R079 Chest pain, unspecified: Secondary | ICD-10-CM

## 2015-05-01 NOTE — Assessment & Plan Note (Signed)
On Lipitor managed by the [facility]

## 2015-05-01 NOTE — Progress Notes (Signed)
Cardiology Office Note   Date:  05/01/2015   ID:  Travis Silva, DOB December 10, 1965, MRN [id number]  PCP:  Travis Kuster, MD  Cardiologist:  Dr. Purvis Silva  Chief Complaint:    History of Present Illness: Travis Silva is a 50 y.o. male who presents for follow-up. Has a history of chest pain and underwent nuclear stress test 10/22/14 which there was no ST segment deviation during stress but a small area of ischemia in the inferolateral wall, overall low risk study greater than 65%. He also has hypertension, PTSD, DM, obesity and sleep apnea on CPAP. He was not having any further chest pain when he saw Travis Riffle, NP on 10/26/14 and was medically managed.  Patient comes in today for routine follow-up. 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 He is retired from [employer] after 26 years. He has lost 20 pounds and walks 3-4 miles daily on his job. He is eating healthy and his hemoglobin A1c was 5.6 when checked at the [place]. He denies any chest pain, tightness, pressure, dyspnea, dyspnea on exertion, dizziness or presyncope.    Past Medical History  Diagnosis Date  . PTSD (post-traumatic stress disorder)   . Diabetes mellitus without complication (HCC)   . Hyperlipidemia   . Hypertension   . GERD (gastroesophageal reflux disease)   . Kidney stone   . Sleep apnea     on C-pap    Past Surgical History  Procedure Laterality Date  . Appendectomy    . Knee surgery       Current Outpatient Prescriptions  Medication Sig Dispense Refill  . albuterol (PROVENTIL HFA;VENTOLIN HFA) 108 (90 BASE) MCG/ACT inhaler Inhale 2 puffs into the lungs every 6 (six) hours as needed for wheezing or shortness of breath. 1 Inhaler 2  . aspirin 81 MG tablet Take 81 mg by mouth daily.    Marland Kitchen atorvastatin (LIPITOR) 80 MG tablet Take 80 mg by mouth daily.    . divalproex (DEPAKOTE) 500 MG DR tablet Take 500 mg by mouth 2 (two) times daily.    . fluticasone  (FLONASE) 50 MCG/ACT nasal spray Place 2 sprays into both nostrils daily. 16 g 6  . glipiZIDE (GLUCOTROL) 5 MG tablet Take 5 mg by mouth daily before breakfast.     . lamoTRIgine (LAMICTAL) 100 MG tablet Take 100 mg by mouth at bedtime.    Marland Kitchen losartan (COZAAR) 50 MG tablet Take 1 tablet (50 mg total) by mouth daily. 90 tablet 1  . lurasidone (LATUDA) 40 MG TABS tablet Take 40 mg by mouth daily with breakfast.    . metFORMIN (GLUCOPHAGE) 500 MG tablet Take 500 mg by mouth 2 (two) times daily with a meal.     . tamsulosin (FLOMAX) 0.4 MG CAPS capsule Take 0.4 mg by mouth.    . venlafaxine (EFFEXOR) 75 MG tablet Take 75 mg by mouth daily.     No current facility-administered medications for this visit.    Allergies:   Penicillins and Ace inhibitors    Social History:  The patient  reports that he quit smoking about 3 years ago. His smoking use included Cigarettes. His smokeless tobacco use includes Snuff. He reports that he does not drink alcohol or use illicit drugs.   Family History:  The patient's    family history includes Coronary artery disease in his father; Diabetes in his father.    ROS:  Please see the history of present illness.  Otherwise, review of systems are positive for none.   All other systems are reviewed and negative.    PHYSICAL EXAM: VS:  BP 118/76 mmHg  Pulse 89  Wt 277 lb (125.646 kg)  SpO2 96% , BMI Body mass index is 36.55 kg/(m^2). GEN: Well nourished, well developed, in no acute distress Neck: no JVD, HJR, carotid bruits, or masses Cardiac: RRR; positive S4 no murmurs, rubs, thrill or heave,  Respiratory:  clear to auscultation bilaterally, normal work of breathing GI: soft, nontender, nondistended, + BS MS: no deformity or atrophy Extremities: without cyanosis, clubbing, edema, good distal pulses bilaterally.  Skin: warm and dry, no rash Neuro:  Strength and sensation are intact    EKG:  EKG is not ordered today.    Recent Labs: 10/10/2014: ALT 45;  BUN 15; Creatinine, Ser 0.94; Hemoglobin 14.3; Platelets 269; Potassium 4.0; Sodium 139    Lipid Panel No results found for: CHOL, TRIG, HDL, CHOLHDL, VLDL, LDLCALC, LDLDIRECT    Wt Readings from Last 3 Encounters:  05/01/15 277 lb (125.646 kg)  10/26/14 297 lb (134.718 kg)  10/10/14 300 lb 14.4 oz (136.487 kg)      Other studies Reviewed: Additional studies/ records that were reviewed today include and review of the records demonstrates:  Nuclear stress chest 10/22/14  There was no ST segment deviation noted during stress. [facility] treadmill score of 8 consistent with low risk for major cardiac events  Findings consistent with prior myocardial infarction with peri-infarct ischemia. Small area of ischemia in the inferolateral wall, overall low risk finding especially combined with exercise results.  This is a low risk study.  The left ventricular ejection fraction is hyperdynamic (>65%).          ASSESSMENT AND PLAN:  Chest pain with moderate risk of acute coronary syndrome Patient had a small area of ischemia in the inferolateral wall on nuclear stress test in 10/2014 but it was considered a low-risk study. Patient has had no further chest pain since then. He has lost 20 pounds and continues to exercise daily and watch his diet. Continue medical management. He is advised to contact us immediately with any symptoms. Follow-up with Dr.Koneswaran in 6 months.  Hyperlipidemia On Lipitor managed by the [facility]  Essential hypertension Blood pressure well controlled. Continue losartan  Diabetes Most recent hemoglobin A1c 5.6 at the [place]  Obesity Patient has lost 20 pounds and continues to eat healthier and exercise.    Travis Clan, PA-C  05/01/2015 1:04 PM    [facility] [address] [place], [place], [place]  [address] Phone: [phone]; Fax: [phone]

## 2015-05-01 NOTE — Assessment & Plan Note (Signed)
Blood pressure well controlled.  Continue losartan. °

## 2015-05-01 NOTE — Assessment & Plan Note (Signed)
Patient had a small area of ischemia in the inferolateral wall on nuclear stress test in 10/2014 but it was considered a low-risk study. Patient has had no further chest pain since then. He has lost 20 pounds and continues to exercise daily and watch his diet. Continue medical management. He is advised to contact us immediately with any symptoms. Follow-up with Dr.Koneswaran in 6 months.

## 2015-05-01 NOTE — Assessment & Plan Note (Signed)
Most recent hemoglobin A1c 5.6 at the [place]

## 2015-05-01 NOTE — Patient Instructions (Signed)
Your physician wants you to follow-up in: 6 Months with Dr. Koneswaran. You will receive a reminder letter in the mail two months in advance. If you don't receive a letter, please call our office to schedule the follow-up appointment.  Your physician recommends that you continue on your current medications as directed. Please refer to the Current Medication list given to you today.  If you need a refill on your cardiac medications before your next appointment, please call your pharmacy.  Thank you for choosing [facility]!   

## 2015-05-01 NOTE — Assessment & Plan Note (Signed)
Patient has lost 20 pounds and continues to eat healthier and exercise.

## 2015-05-02 HISTORY — PX: BLADDER SURGERY: SHX569

## 2015-05-03 ENCOUNTER — Encounter: Payer: Self-pay | Admitting: Nurse Practitioner

## 2015-05-03 ENCOUNTER — Other Ambulatory Visit: Payer: Self-pay | Admitting: Nurse Practitioner

## 2015-05-03 ENCOUNTER — Ambulatory Visit (INDEPENDENT_AMBULATORY_CARE_PROVIDER_SITE_OTHER): Admitting: Nurse Practitioner

## 2015-05-03 VITALS — BP 124/86 | HR 90 | Temp 97.3°F | Ht 73.0 in | Wt 279.0 lb

## 2015-05-03 DIAGNOSIS — R369 Urethral discharge, unspecified: Secondary | ICD-10-CM

## 2015-05-03 MED ORDER — SULFAMETHOXAZOLE-TRIMETHOPRIM 800-160 MG PO TABS: 1.0000 | ORAL_TABLET | Freq: Two times a day (BID) | ORAL | Status: DC

## 2015-05-03 MED ORDER — CYCLOBENZAPRINE HCL 10 MG PO TABS: 10.0000 mg | ORAL_TABLET | Freq: Three times a day (TID) | ORAL | Status: DC | PRN

## 2015-05-03 NOTE — Progress Notes (Signed)
   Subjective:    Patient ID: Travis Silva, male    DOB: [birth year], 50 y.o.   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 He noticed this morning that he had drainage around head of penis- patient says it is foul smelling. They gave him 2 antibiotics to take (Levofloxicin).   Review of Systems  Constitutional: Negative.   HENT: Negative.   Respiratory: Negative.   Cardiovascular: Negative.   Genitourinary: Negative.   Neurological: Negative.   Psychiatric/Behavioral: Negative.   All other systems reviewed and are negative.      Objective:   Physical Exam  Constitutional: He is oriented to person, place, and time. He appears well-developed and well-nourished.  Cardiovascular: Normal rate, regular rhythm and normal heart sounds.   Pulmonary/Chest: Effort normal and breath sounds normal.  Genitourinary:  serisanguinous drainage from head of penis around foley catheter. No blood in tubing  Neurological: He is alert and oriented to person, place, and time.  Skin: Skin is warm.  Psychiatric: He has a normal mood and affect. His behavior is normal. Judgment and thought content normal.    BP 124/86 mmHg  Pulse 90  Temp(Src) 97.3 F (36.3 C) (Oral)  Ht  (1.854 m)  Wt 279 lb (126.554 kg)  BMI 36.82 kg/m2       Assessment & Plan:   1. Penile discharge    Meds ordered this encounter  Medications  . sulfamethoxazole-trimethoprim (BACTRIM DS) 800-160 MG tablet    Sig: Take 1 tablet by mouth 2 (two) times daily.    Dispense:  14 tablet    Refill:  0    Order Specific Question:  Supervising Provider    Answer:  Ernestina Penna [[id number]]   Force fluids Keep follow up with urologist  Mary-Margaret Daphine Deutscher, FNP

## 2015-09-20 ENCOUNTER — Ambulatory Visit (INDEPENDENT_AMBULATORY_CARE_PROVIDER_SITE_OTHER): Admitting: Nurse Practitioner

## 2015-09-20 ENCOUNTER — Encounter: Payer: Self-pay | Admitting: Nurse Practitioner

## 2015-09-20 VITALS — BP 117/85 | HR 83 | Temp 97.2°F | Ht 73.0 in | Wt 277.0 lb

## 2015-09-20 DIAGNOSIS — G4700 Insomnia, unspecified: Secondary | ICD-10-CM | POA: Diagnosis not present

## 2015-09-20 MED ORDER — SUVOREXANT 15 MG PO TABS: 1.0000 | ORAL_TABLET | Freq: Every evening | ORAL | Status: DC | PRN

## 2015-09-20 MED ORDER — SUVOREXANT 15 MG PO TABS: 1.0000 | ORAL_TABLET | Freq: Every day | ORAL | Status: DC

## 2015-09-20 NOTE — Patient Instructions (Signed)
Insomnia Insomnia is a sleep disorder that makes it difficult to fall asleep or to stay asleep. Insomnia can cause tiredness (fatigue), low energy, difficulty concentrating, mood swings, and poor performance at work or school.  There are three different ways to classify insomnia:  Difficulty falling asleep.  Difficulty staying asleep.  Waking up too early in the morning. Any type of insomnia can be long-term (chronic) or short-term (acute). Both are common. Short-term insomnia usually lasts for three months or less. Chronic insomnia occurs at least three times a week for longer than three months. CAUSES  Insomnia may be caused by another condition, situation, or substance, such as:  Anxiety.  Certain medicines.  Gastroesophageal reflux disease (GERD) or other gastrointestinal conditions.  Asthma or other breathing conditions.  Restless legs syndrome, sleep apnea, or other sleep disorders.  Chronic pain.  Menopause. This may include hot flashes.  Stroke.  Abuse of alcohol, tobacco, or illegal drugs.  Depression.  Caffeine.   Neurological disorders, such as Alzheimer disease.  An overactive thyroid (hyperthyroidism). The cause of insomnia may not be known. RISK FACTORS Risk factors for insomnia include:  Gender. Women are more commonly affected than men.  Age. Insomnia is more common as you get older.  Stress. This may involve your professional or personal life.  Income. Insomnia is more common in people with lower income.  Lack of exercise.   Irregular work schedule or night shifts.  Traveling between different time zones. SIGNS AND SYMPTOMS If you have insomnia, trouble falling asleep or trouble staying asleep is the main symptom. This may lead to other symptoms, such as:  Feeling fatigued.  Feeling nervous about going to sleep.  Not feeling rested in the morning.  Having trouble concentrating.  Feeling irritable, anxious, or depressed. TREATMENT   Treatment for insomnia depends on the cause. If your insomnia is caused by an underlying condition, treatment will focus on addressing the condition. Treatment may also include:   Medicines to help you sleep.  Counseling or therapy.  Lifestyle adjustments. HOME CARE INSTRUCTIONS   Take medicines only as directed by your health care provider.  Keep regular sleeping and waking hours. Avoid naps.  Keep a sleep diary to help you and your health care provider figure out what could be causing your insomnia. Include:   When you sleep.  When you wake up during the night.  How well you sleep.   How rested you feel the next day.  Any side effects of medicines you are taking.  What you eat and drink.   Make your bedroom a comfortable place where it is easy to fall asleep:  Put up shades or special blackout curtains to block light from outside.  Use a white noise machine to block noise.  Keep the temperature cool.   Exercise regularly as directed by your health care provider. Avoid exercising right before bedtime.  Use relaxation techniques to manage stress. Ask your health care provider to suggest some techniques that may work well for you. These may include:  Breathing exercises.  Routines to release muscle tension.  Visualizing peaceful scenes.  Cut back on alcohol, caffeinated beverages, and cigarettes, especially close to bedtime. These can disrupt your sleep.  Do not overeat or eat spicy foods right before bedtime. This can lead to digestive discomfort that can make it hard for you to sleep.  Limit screen use before bedtime. This includes:  Watching TV.  Using your smartphone, tablet, and computer.  Stick to a routine. This   can help you fall asleep faster. Try to do a quiet activity, brush your teeth, and go to bed at the same time each night.  Get out of bed if you are still awake after 15 minutes of trying to sleep. Keep the lights down, but try reading or  doing a quiet activity. When you feel sleepy, go back to bed.  Make sure that you drive carefully. Avoid driving if you feel very sleepy.  Keep all follow-up appointments as directed by your health care provider. This is important. SEEK MEDICAL CARE IF:   You are tired throughout the day or have trouble in your daily routine due to sleepiness.  You continue to have sleep problems or your sleep problems get worse. SEEK IMMEDIATE MEDICAL CARE IF:   You have serious thoughts about hurting yourself or someone else.   This information is not intended to replace advice given to you by your health care provider. Make sure you discuss any questions you have with your health care provider.   Document Released: 02/21/2000 Document Revised: 11/14/2014 Document Reviewed: 11/24/2013 Elsevier Interactive Patient Education 2016 Elsevier Inc.  

## 2015-09-20 NOTE — Progress Notes (Signed)
   Subjective:    Patient ID: Travis Silva, male    DOB: [birth year], 50 y.o.   MRN: [id number]  HPI  Patient in today to discuss insomnia. He says for the past several weeks he has not been able to fall asleep and when he does he only sleeps a couple of hours. Doesn't fall asleep until 3am and then wakes up at 5am. He has had this happen in the past and has tried [place] but that made him feel bad the next day. He denies any new stressers in his life and work schedule has not changedJust needs to get some sleep.    Review of Systems  Constitutional: Negative.   HENT: Negative.   Respiratory: Negative.   Cardiovascular: Negative.   Genitourinary: Negative.   All other systems reviewed and are negative.      Objective:   Physical Exam  Constitutional: He is oriented to person, place, and time. He appears well-developed and well-nourished.  Cardiovascular: Normal rate, regular rhythm and normal heart sounds.   Pulmonary/Chest: Effort normal and breath sounds normal.  Neurological: He is alert and oriented to person, place, and time.  Skin: Skin is warm.  Psychiatric: He has a normal mood and affect. His behavior is normal. Judgment and thought content normal.   BP 117/85 mmHg  Pulse 83  Temp(Src) 97.2 F (36.2 C) (Oral)  Ht 6\' 1"  (1.854 m)  Wt 277 lb (125.646 kg)  BMI 36.55 kg/m2        Assessment & Plan:  1. Insomnia Bedtime routine - Suvorexant (BELSOMRA) 15 MG TABS; Take 1 tablet by mouth at bedtime and may repeat dose one time if needed.  Dispense: 10 tablet; Refill: 0 - Suvorexant (BELSOMRA) 15 MG TABS; Take 1 tablet by mouth at bedtime.  Dispense: 30 tablet; Refill: 5 RTO prn  Mary-Margaret Daphine DeutscherMartin, FNP

## 2015-09-24 ENCOUNTER — Telehealth: Payer: Self-pay

## 2015-09-24 NOTE — Telephone Encounter (Signed)
Insurance authorized [employer] ControlsBelsomra

## 2015-10-02 ENCOUNTER — Telehealth: Payer: Self-pay | Admitting: Nurse Practitioner

## 2015-10-03 ENCOUNTER — Other Ambulatory Visit: Payer: Self-pay | Admitting: Nurse Practitioner

## 2015-10-03 DIAGNOSIS — G4700 Insomnia, unspecified: Secondary | ICD-10-CM

## 2015-10-03 MED ORDER — TRAZODONE HCL 50 MG PO TABS: 50.0000 mg | ORAL_TABLET | Freq: Every evening | ORAL | 3 refills | Status: DC | PRN

## 2015-10-03 NOTE — Telephone Encounter (Signed)
Has he tried trazadone in the past?

## 2015-10-03 NOTE — Telephone Encounter (Signed)
Will send in trazadone rx and see if works

## 2015-10-03 NOTE — Telephone Encounter (Signed)
Pt aware Rx has been sent to pharmacy 

## 2015-10-03 NOTE — Telephone Encounter (Signed)
Pt has not taken trazodone before

## 2015-11-14 ENCOUNTER — Encounter: Payer: Self-pay | Admitting: Nurse Practitioner

## 2015-11-14 ENCOUNTER — Ambulatory Visit (INDEPENDENT_AMBULATORY_CARE_PROVIDER_SITE_OTHER): Admitting: Nurse Practitioner

## 2015-11-14 VITALS — BP 120/85 | HR 87 | Temp 97.0°F | Ht 73.0 in | Wt 277.0 lb

## 2015-11-14 DIAGNOSIS — J0100 Acute maxillary sinusitis, unspecified: Secondary | ICD-10-CM | POA: Diagnosis not present

## 2015-11-14 DIAGNOSIS — K219 Gastro-esophageal reflux disease without esophagitis: Secondary | ICD-10-CM | POA: Diagnosis not present

## 2015-11-14 MED ORDER — CYCLOBENZAPRINE HCL 10 MG PO TABS: 10.0000 mg | ORAL_TABLET | Freq: Three times a day (TID) | ORAL | 1 refills | Status: DC | PRN

## 2015-11-14 MED ORDER — OMEPRAZOLE 40 MG PO CPDR: 40.0000 mg | DELAYED_RELEASE_CAPSULE | Freq: Every day | ORAL | 3 refills | Status: DC

## 2015-11-14 MED ORDER — AZITHROMYCIN 250 MG PO TABS: ORAL_TABLET | ORAL | 0 refills | Status: DC

## 2015-11-14 NOTE — Patient Instructions (Signed)

## 2015-11-14 NOTE — Progress Notes (Signed)
Subjective:    Patient ID: Philippe Gang, male    DOB: [birth year], 50 y.o.   MRN: [id number]  HPI Patient comes in today c/o persistent cough. It started 2 months ago. Worse when he is sitting or standing- seems to be worse in the mornings then in the evenings. Has a deep tickle in upper chest constantly. The last week he has also developed congestion and facial pressure.   * he  Has stopped taking his losartan because he just did not want to be on it anymore- says that he feels fine- checks blood pressure at home and it has been running in 120's systolic.  Review of Systems  Constitutional: Positive for fatigue. Negative for chills and fever.  HENT: Positive for congestion and sinus pressure. Negative for ear pain, sore throat and trouble swallowing.   Respiratory: Positive for cough. Negative for shortness of breath.   Cardiovascular: Negative.   Gastrointestinal: Negative.   Genitourinary: Negative.   Neurological: Negative.   Psychiatric/Behavioral: Negative.   All other systems reviewed and are negative.      Objective:   Physical Exam  Constitutional: He is oriented to person, place, and time. He appears well-developed and well-nourished. No distress.  HENT:  Right Ear: Hearing, tympanic membrane, external ear and ear canal normal.  Left Ear: Hearing, tympanic membrane, external ear and ear canal normal.  Nose: Mucosal edema and rhinorrhea present. Right sinus exhibits maxillary sinus tenderness. Right sinus exhibits no frontal sinus tenderness. Left sinus exhibits maxillary sinus tenderness. Left sinus exhibits no frontal sinus tenderness.  Mouth/Throat: Uvula is midline and mucous membranes are normal.  Neck: Normal range of motion. Neck supple.  Cardiovascular: Normal rate, regular rhythm and normal heart sounds.   Pulmonary/Chest: Effort normal and breath sounds normal.  Abdominal: Soft. Bowel sounds are normal.  Lymphadenopathy:    He has no cervical adenopathy.    Neurological: He is alert and oriented to person, place, and time.  Skin: Skin is warm.  Psychiatric: He has a normal mood and affect. His behavior is normal. Judgment and thought content normal.   BP 120/85   Pulse 87   Temp 97 F (36.1 C) (Oral)   Ht 6\' 1"  (1.854 m)   Wt 277 lb (125.6 kg)   BMI 36.55 kg/m         Assessment & Plan:  1. Gastroesophageal reflux disease without esophagitis Avoid spicy foods Do not eat 2 hours prior to bedtime Stop copenhagen - omeprazole (PRILOSEC) 40 MG capsule; Take 1 capsule (40 mg total) by mouth daily.  Dispense: 30 capsule; Refill: 3  2. Acute maxillary sinusitis, recurrence not specified 1. Take meds as prescribed 2. Use a cool mist humidifier especially during the winter months and when heat has been humid. 3. Use saline nose sprays frequently 4. Saline irrigations of the nose can be very helpful if done frequently.  * 4X daily for 1 week*  * Use of a nettie pot can be helpful with this. Follow directions with this* 5. Drink plenty of fluids 6. Keep thermostat turn down low 7.For any cough or congestion  Use plain Mucinex- regular strength or max strength is fine   * Children- consult with Pharmacist for dosing 8. For fever or aces or pains- take tylenol or ibuprofen appropriate for age and weight.  * for fevers greater than 101 orally you may alternate ibuprofen and tylenol every  3 hours.    - azithromycin (ZITHROMAX Z-PAK) 250 MG tablet; As directed  Dispense: 6 tablet; Refill: 0  Mary-Margaret Daphine DeutscherMartin, FNP

## 2015-11-18 ENCOUNTER — Other Ambulatory Visit: Payer: Self-pay | Admitting: Family Medicine

## 2016-01-23 ENCOUNTER — Other Ambulatory Visit: Payer: Self-pay | Admitting: Nurse Practitioner

## 2016-01-23 DIAGNOSIS — G4700 Insomnia, unspecified: Secondary | ICD-10-CM

## 2016-02-13 ENCOUNTER — Other Ambulatory Visit: Payer: Self-pay | Admitting: Nurse Practitioner

## 2016-02-13 ENCOUNTER — Telehealth: Payer: Self-pay | Admitting: Nurse Practitioner

## 2016-02-13 DIAGNOSIS — F5101 Primary insomnia: Secondary | ICD-10-CM

## 2016-02-13 MED ORDER — TRAZODONE HCL 50 MG PO TABS: 75.0000 mg | ORAL_TABLET | Freq: Every day | ORAL | 2 refills | Status: DC

## 2016-02-13 NOTE — Telephone Encounter (Signed)
Pt states he has had to take 75mg  instead of 50 mg to sleep at night and only has 5 left. Can he get a new rx stating take 1 1/2 tablet at night? Please advise.

## 2016-02-13 NOTE — Telephone Encounter (Signed)
Pt notified of rx. 

## 2016-02-13 NOTE — Telephone Encounter (Signed)
rx sent to pharmacy for trazadone

## 2016-03-24 ENCOUNTER — Ambulatory Visit (INDEPENDENT_AMBULATORY_CARE_PROVIDER_SITE_OTHER): Admitting: Nurse Practitioner

## 2016-03-24 ENCOUNTER — Encounter: Payer: Self-pay | Admitting: Internal Medicine

## 2016-03-24 ENCOUNTER — Ambulatory Visit (INDEPENDENT_AMBULATORY_CARE_PROVIDER_SITE_OTHER)

## 2016-03-24 ENCOUNTER — Encounter: Payer: Self-pay | Admitting: Nurse Practitioner

## 2016-03-24 VITALS — BP 118/82 | HR 87 | Temp 97.7°F | Ht 73.0 in | Wt 277.0 lb

## 2016-03-24 DIAGNOSIS — R05 Cough: Secondary | ICD-10-CM

## 2016-03-24 DIAGNOSIS — K219 Gastro-esophageal reflux disease without esophagitis: Secondary | ICD-10-CM | POA: Diagnosis not present

## 2016-03-24 DIAGNOSIS — R059 Cough, unspecified: Secondary | ICD-10-CM

## 2016-03-24 MED ORDER — VENLAFAXINE HCL 75 MG PO TABS: 75.0000 mg | ORAL_TABLET | Freq: Every day | ORAL | 0 refills | Status: AC

## 2016-03-24 MED ORDER — PANTOPRAZOLE SODIUM 40 MG PO TBEC
40.0000 mg | DELAYED_RELEASE_TABLET | Freq: Every day | ORAL | 3 refills | Status: DC
Start: 2016-03-24 — End: 2017-05-12

## 2016-03-24 NOTE — Patient Instructions (Signed)
Gastroesophageal Reflux Scan A gastroesophageal reflux scan is a procedure that is used to check for gastroesophageal reflux, which is the backward flow of stomach contents into the tube that carries food from the mouth to the stomach (esophagus). The scan can also show if any stomach contents are inhaled (aspirated) into your lungs. You may need this scan if you have symptoms such as heartburn, vomiting, swallowing problems, or regurgitation. Regurgitation means that swallowed food is returning from the stomach to the esophagus. For this scan, you will drink a liquid that contains a small amount of a radioactive substance (tracer). A scanner with a camera that detects the radioactive tracer is used to see if any of the material backs up into your esophagus. Tell a health care provider about:  Any allergies you have.  All medicines you are taking, including vitamins, herbs, eye drops, creams, and over-the-counter medicines.  Any blood disorders you have.  Any surgeries you have had.  Any medical conditions you have.  If you are pregnant or you think that you may be pregnant.  If you are breastfeeding. What are the risks? Generally, this is a safe procedure. However, problems may occur, including:  Exposure to radiation (a small amount).  Allergic reaction to the radioactive substance. This is rare. What happens before the procedure?  Ask your health care provider about changing or stopping your regular medicines. This is especially important if you are taking diabetes medicines or blood thinners.  Follow your health care provider's instructions about eating or drinking restrictions. What happens during the procedure?  You will be asked to drink a liquid that contains a small amount of a radioactive tracer. This liquid will probably be similar to orange juice.  You will assume a position lying on your back.  A series of images will be taken of your esophagus and upper  stomach.  You may be asked to move into different positions to help determine if reflux occurs more often when you are in specific positions.  For adults, an abdominal binder with an inflatable cuff may be placed on the belly (abdomen). This may be used to increase abdominal pressure. More images will be taken to see if the increased pressure causes reflux to occur. The procedure may vary among health care providers and hospitals. What happens after the procedure?  Return to your normal activities and your normal diet as directed by your health care provider.  The radioactive tracer will leave your body over the next few days. Drink enough fluid to keep your urine clear or pale yellow. This will help to flush the tracer out of your body.  It is your responsibility to obtain your test results. Ask your health care provider or the department performing the test when and how you will get your results. This information is not intended to replace advice given to you by your health care provider. Make sure you discuss any questions you have with your health care provider. Document Released: 04/16/2005 Document Revised: 11/18/2015 Document Reviewed: 12/05/2013 Elsevier Interactive Patient Education  2017 Elsevier Inc.  

## 2016-03-24 NOTE — Addendum Note (Signed)
Addended by: Bennie PieriniMARTIN, MARY-MARGARET on: 03/24/2016 12:11 PM   Modules accepted: Orders

## 2016-03-24 NOTE — Progress Notes (Signed)
   Subjective:    Patient ID: Travis [name], male    DOB: [birth year], 51 y.o.   MRN: [id number]  HPI Patient comes in today c/o cough. Is constant. Almost feels like there is something" itching in his chest". Worse in mornings when he wakes up. Has tried flonase- no help. Omeprazole- no help. Nothing seems to make it better or worse.    Review of Systems  Constitutional: Negative.   HENT: Negative.   Respiratory: Positive for cough.   Cardiovascular: Negative.   Gastrointestinal: Negative.   Genitourinary: Negative.   Neurological: Negative.   Psychiatric/Behavioral: Negative.   All other systems reviewed and are negative.      Objective:   Physical Exam  Constitutional: He appears well-developed and well-nourished. No distress.  HENT:  Right Ear: External ear normal.  Left Ear: External ear normal.  Nose: Nose normal.  Mouth/Throat: Oropharynx is clear and moist.  Cardiovascular: Normal rate and regular rhythm.   Pulmonary/Chest: Effort normal.  Skin: Skin is warm.  Psychiatric: He has a normal mood and affect. His behavior is normal. Judgment and thought content normal.    BP 118/82   Pulse 87   Temp 97.7 F (36.5 C) (Oral)   Ht 6\' 1"  (1.854 m)   Wt 277 lb (125.6 kg)   BMI 36.55 kg/m   chest x ray- no abnormalities reading by Paulene FloorMary Asher Torpey, FNP  [facility]      Assessment & Plan:   1. Cough   2. Gastroesophageal reflux disease without esophagitis    Meds ordered this encounter  Medications  . pantoprazole (PROTONIX) 40 MG tablet    Sig: Take 1 tablet (40 mg total) by mouth daily.    Dispense:  30 tablet    Refill:  3    Order Specific Question:   Supervising Provider    Answer:   Johna SheriffVINCENT, CAROL L [[id number]]   Force fluids Avoid spicy and fatty foods Will wait and see what GI says  Mary-Margaret Daphine DeutscherMartin, FNP

## 2016-05-11 ENCOUNTER — Ambulatory Visit: Admitting: Nurse Practitioner

## 2016-05-12 ENCOUNTER — Encounter: Payer: Self-pay | Admitting: Family Medicine

## 2016-05-14 ENCOUNTER — Telehealth: Payer: Self-pay | Admitting: Family Medicine

## 2016-05-14 NOTE — Telephone Encounter (Signed)
appt scheduled Pt notified 

## 2016-05-15 ENCOUNTER — Encounter: Payer: Self-pay | Admitting: Nurse Practitioner

## 2016-05-15 ENCOUNTER — Ambulatory Visit (INDEPENDENT_AMBULATORY_CARE_PROVIDER_SITE_OTHER): Admitting: Nurse Practitioner

## 2016-05-15 VITALS — BP 125/84 | HR 72 | Temp 97.8°F | Ht 73.0 in | Wt 271.0 lb

## 2016-05-15 DIAGNOSIS — B354 Tinea corporis: Secondary | ICD-10-CM

## 2016-05-15 MED ORDER — CLOTRIMAZOLE-BETAMETHASONE 1-0.05 % EX CREA: 1.0000 "application " | TOPICAL_CREAM | Freq: Two times a day (BID) | CUTANEOUS | 0 refills | Status: DC

## 2016-05-15 NOTE — Patient Instructions (Signed)
Body Ringworm Body ringworm is an infection of the skin that often causes a ring-shaped rash. Body ringworm can affect any part of your skin. It can spread easily to others. Body ringworm is also called tinea corporis. What are the causes? This condition is caused by funguses called dermatophytes. The condition develops when these funguses grow out of control on the skin. You can get this condition if you touch a person or animal that has it. You can also get it if you share clothing, bedding, towels, or any other object with an infected person or pet. What increases the risk? This condition is more likely to develop in:  Athletes who often make skin-to-skin contact with other athletes, such as wrestlers.  People who share equipment and mats.  People with a weakened immune system. What are the signs or symptoms? Symptoms of this condition include:  Itchy, raised red spots and bumps.  Red scaly patches.  A ring-shaped rash. The rash may have:  A clear center.  Scales or red bumps at its center.  Redness near its borders.  Dry and scaly skin on or around it. How is this diagnosed? This condition can usually be diagnosed with a skin exam. A skin scraping may be taken from the affected area and examined under a microscope to see if the fungus is present. How is this treated? This condition may be treated with:  An antifungal cream or ointment.  An antifungal shampoo.  Antifungal medicines. These may be prescribed if your ringworm is severe, keeps coming back, or lasts a long time. Follow these instructions at home:  Take over-the-counter and prescription medicines only as told by your health care provider.  If you were given an antifungal cream or ointment:  Use it as told by your health care provider.  Wash the infected area and dry it completely before applying the cream or ointment.  If you were given an antifungal shampoo:  Use it as told by your health care  provider.  Leave the shampoo on your body for 3-5 minutes before rinsing.  While you have a rash:  Wear loose clothing to stop clothes from rubbing and irritating it.  Wash or change your bed sheets every night.  If your pet has the same infection, take your pet to see a veterinarian. How is this prevented?  Practice good hygiene.  Wear sandals or shoes in public places and showers.  Do not share personal items with others.  Avoid touching red patches of skin on other people.  Avoid touching pets that have bald spots.  If you touch an animal that has a bald spot, wash your hands. Contact a health care provider if:  Your rash continues to spread after 7 days of treatment.  Your rash is not gone in 4 weeks.  The area around your rash gets red, warm, tender, and swollen. This information is not intended to replace advice given to you by your health care provider. Make sure you discuss any questions you have with your health care provider. Document Released: 02/21/2000 Document Revised: 08/01/2015 Document Reviewed: 12/20/2014 Elsevier Interactive Patient Education  2017 Elsevier Inc.  

## 2016-05-15 NOTE — Progress Notes (Signed)
   Subjective:    Patient ID: Travis Silva, male    DOB: 05/01/1965, 51 y.o.   MRN: [id number]  HPI Patient had flu last 2 weeks and went to stay in hotel so as not to infect his family. After his stay there he  Noticed a red place on lower right leg. Has gotten bigger over the last several days- denies itching.    Review of Systems  Constitutional: Negative.   HENT: Negative.   Respiratory: Negative.   Cardiovascular: Negative.   Gastrointestinal: Negative.   Musculoskeletal: Negative.   Neurological: Negative.   Psychiatric/Behavioral: Negative.   All other systems reviewed and are negative.      Objective:   Physical Exam  Constitutional: He is oriented to person, place, and time. He appears well-developed and well-nourished. No distress.  Cardiovascular: Normal rate and regular rhythm.   Pulmonary/Chest: Effort normal and breath sounds normal.  Neurological: He is alert and oriented to person, place, and time.  Skin: Skin is warm.  3cm annular macular lesion right medial lower leg  Psychiatric: He has a normal mood and affect. His behavior is normal. Judgment and thought content normal.    BP 125/84   Pulse 72   Temp 97.8 F (36.6 C) (Oral)   Ht 6\' 1"  (1.854 m)   Wt 271 lb (122.9 kg)   BMI 35.75 kg/m        Assessment & Plan:  1. Tinea corporis Do not scratch or rub Wash hands after applying ointment - clotrimazole-betamethasone (LOTRISONE) cream; Apply 1 application topically 2 (two) times daily.  Dispense: 30 g; Refill: 0  Mary-Margaret Daphine DeutscherMartin, FNP

## 2016-05-19 ENCOUNTER — Telehealth: Payer: Self-pay | Admitting: *Deleted

## 2016-05-19 ENCOUNTER — Ambulatory Visit: Admitting: Internal Medicine

## 2016-05-19 NOTE — Telephone Encounter (Signed)
No show letter mailed to patient. 

## 2016-07-17 ENCOUNTER — Telehealth: Payer: Self-pay | Admitting: Pharmacist

## 2016-07-17 NOTE — Telephone Encounter (Signed)
Need documentation of most recent A1c.  [facility] and will bring in copy of labs.

## 2016-07-28 ENCOUNTER — Telehealth: Payer: Self-pay | Admitting: Nurse Practitioner

## 2016-07-28 MED ORDER — SCOPOLAMINE 1 MG/3DAYS TD PT72: 1.0000 | MEDICATED_PATCH | TRANSDERMAL | 1 refills | Status: AC

## 2016-07-28 NOTE — Telephone Encounter (Signed)
Patient notified that patches sent to pharmacy

## 2016-07-28 NOTE — Telephone Encounter (Signed)
Scopolamine transdermal sent to [facility]

## 2016-07-28 NOTE — Telephone Encounter (Signed)
Please advise 

## 2016-08-14 ENCOUNTER — Other Ambulatory Visit: Payer: Self-pay | Admitting: Nurse Practitioner

## 2016-09-09 ENCOUNTER — Other Ambulatory Visit: Payer: Self-pay | Admitting: Nurse Practitioner

## 2016-10-12 ENCOUNTER — Ambulatory Visit: Admitting: Family Medicine

## 2016-10-13 ENCOUNTER — Encounter: Payer: Self-pay | Admitting: Family Medicine

## 2016-11-15 IMAGING — CR DG CHEST 2V
2 series · 2 of 2 positions shown · non-contrast
Comparison: None.

CLINICAL DATA: Cough.

EXAM:
CHEST  2 VIEW

[view not recorded (1 of 2)]
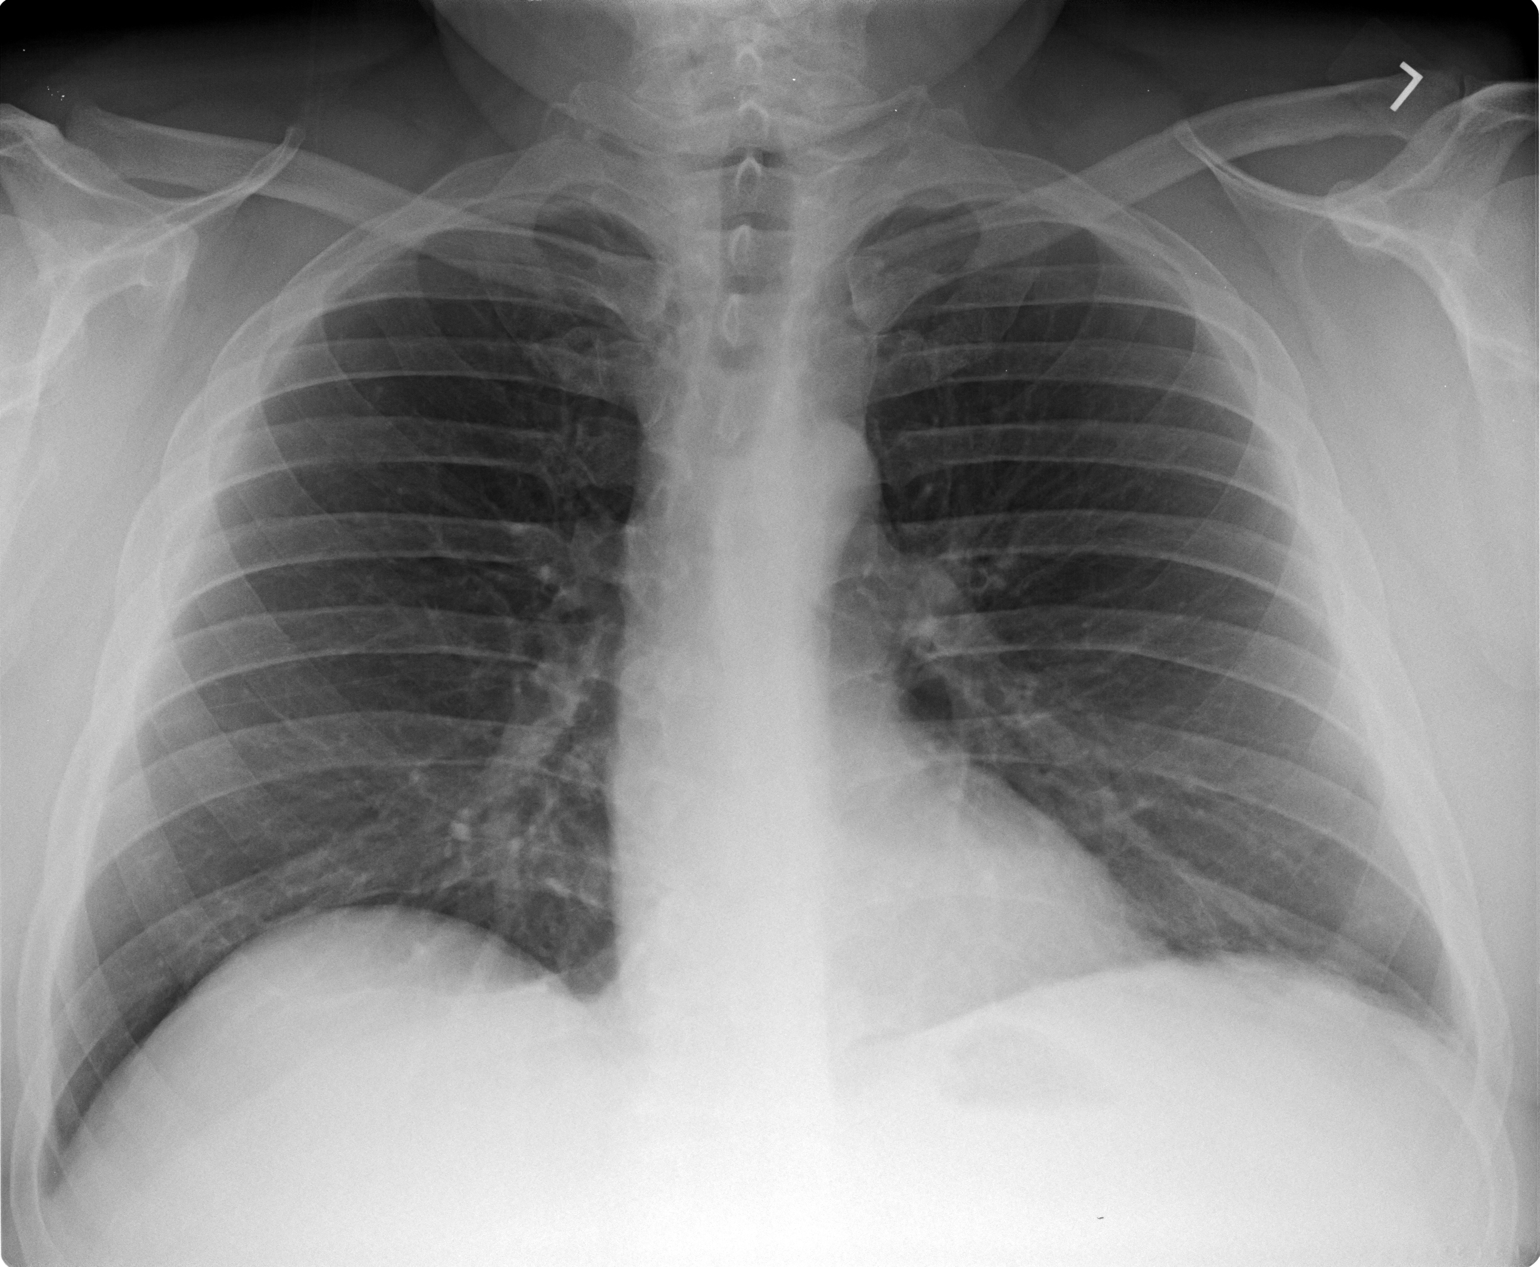

[view not recorded (2 of 2)]
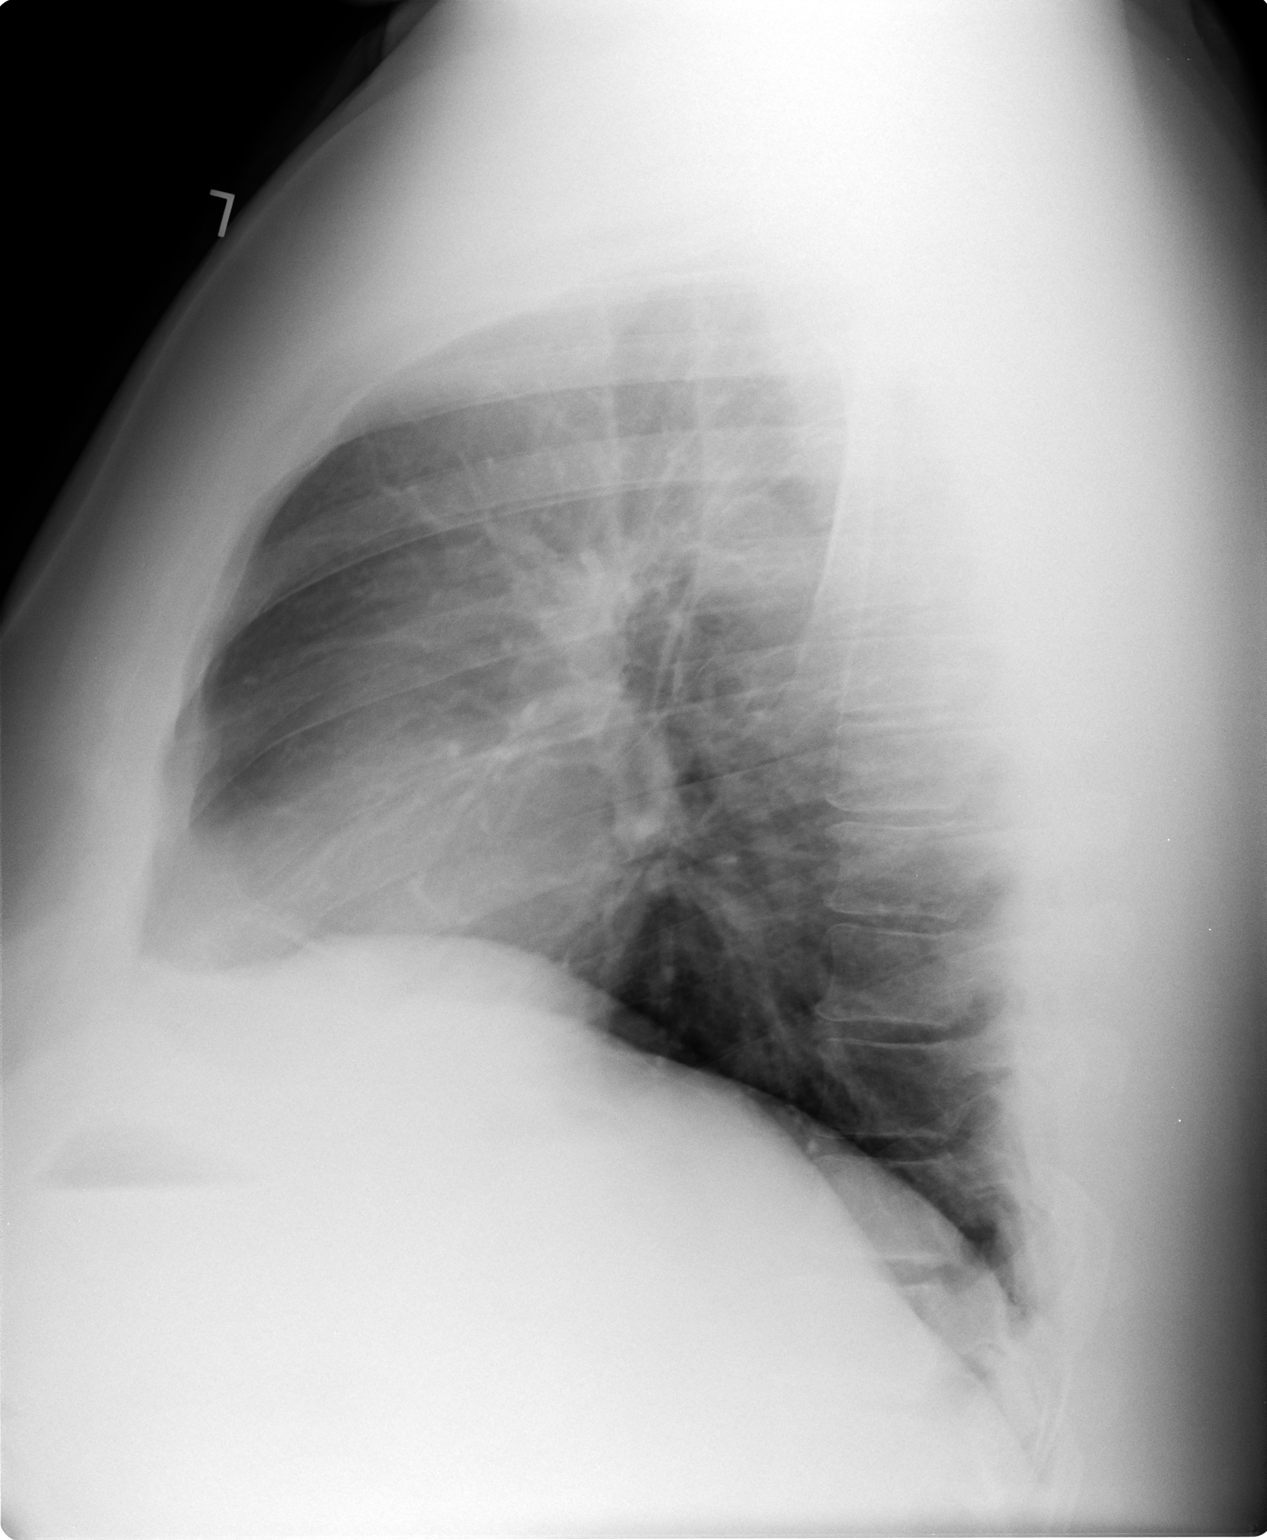

[2 of 2 positions shown; findings below may reference images not displayed]

FINDINGS: Mediastinum and hilar structures are normal. Mild bibasilar
subsegmental atelectasis and/or infiltrates. No pleural effusion or
pneumothorax. Heart size normal. No acute bony abnormality .
IMPRESSION: Mild bibasilar subsegmental atelectasis and/or infiltrates.

## 2016-11-16 ENCOUNTER — Other Ambulatory Visit: Payer: Self-pay | Admitting: Nurse Practitioner

## 2016-11-24 ENCOUNTER — Telehealth: Payer: Self-pay | Admitting: Nurse Practitioner

## 2016-11-24 MED ORDER — ONDANSETRON HCL 4 MG PO TABS: 4.0000 mg | ORAL_TABLET | Freq: Three times a day (TID) | ORAL | 0 refills | Status: AC | PRN

## 2016-11-24 NOTE — Addendum Note (Signed)
Addended by: Bennie Pierini on: 11/24/2016 04:35 PM   Modules accepted: Orders

## 2016-11-24 NOTE — Telephone Encounter (Signed)
Patient called stating that he has kidney stones and is in a lot of pain and would like something for nausea sent to pharmacy

## 2016-11-24 NOTE — Telephone Encounter (Signed)
Patient aware that zofran has been sent to pharmacy

## 2016-11-24 NOTE — Telephone Encounter (Signed)
Left detailed message to pt regarding RX

## 2016-11-25 ENCOUNTER — Other Ambulatory Visit: Payer: Self-pay | Admitting: Family Medicine

## 2016-12-27 ENCOUNTER — Other Ambulatory Visit: Payer: Self-pay | Admitting: Nurse Practitioner

## 2016-12-29 NOTE — Telephone Encounter (Signed)
Will refill x1.  Patient has not been seen for insomnia in >1 year.  Please have him schedule an appt.

## 2016-12-29 NOTE — Telephone Encounter (Signed)
Patient aware that he needs an appointment  

## 2017-05-11 ENCOUNTER — Telehealth: Payer: Self-pay | Admitting: Family Medicine

## 2017-05-11 NOTE — Telephone Encounter (Signed)
Sorry but needs to be seen or can do evisit

## 2017-05-11 NOTE — Telephone Encounter (Signed)
Returned patient's phone call.  Patient states that he has a fever 100.3, sinus pain and nasal congestion.

## 2017-05-12 ENCOUNTER — Ambulatory Visit (INDEPENDENT_AMBULATORY_CARE_PROVIDER_SITE_OTHER): Admitting: Family Medicine

## 2017-05-12 ENCOUNTER — Encounter: Payer: Self-pay | Admitting: Family Medicine

## 2017-05-12 VITALS — BP 134/83 | HR 104 | Temp 98.9°F | Ht 73.0 in | Wt 276.0 lb

## 2017-05-12 DIAGNOSIS — J329 Chronic sinusitis, unspecified: Secondary | ICD-10-CM

## 2017-05-12 DIAGNOSIS — J40 Bronchitis, not specified as acute or chronic: Secondary | ICD-10-CM | POA: Diagnosis not present

## 2017-05-12 DIAGNOSIS — R52 Pain, unspecified: Secondary | ICD-10-CM

## 2017-05-12 LAB — VERITOR FLU A/B WAIVED
INFLUENZA A: NEGATIVE
Influenza B: NEGATIVE

## 2017-05-12 MED ORDER — LEVOFLOXACIN 500 MG PO TABS: 500.0000 mg | ORAL_TABLET | Freq: Every day | ORAL | 0 refills | Status: AC

## 2017-05-12 MED ORDER — BETAMETHASONE SOD PHOS & ACET 6 (3-3) MG/ML IJ SUSP
6.0000 mg | Freq: Once | INTRAMUSCULAR | Status: AC
  Administered 2017-05-12: 6 mg via INTRAMUSCULAR

## 2017-05-12 NOTE — Progress Notes (Signed)
Chief Complaint  Patient presents with  . Sinus Problem    pt here today c/o sinus pressure, eyes burning, congestion    HPI  Patient presents today for Patient presents with upper respiratory congestion. Burning around eyes. Pressure in the cheeks, forehead and left ear.Rhinorrhea that isclear. There is moderate sore throat due to post nasal drainage. Patient reports coughing frequently as well.  Green sputum noted. There is subjective, low grade fever without chills or sweats. The patient denies being short of breath. Onset was 3-5 days ago. Gradually worsening. Tried OTCs without improvement.  PMH: Smoking status noted ROS: Per HPI  Objective: BP 134/83   Pulse (!) 104   Temp 98.9 F (37.2 C) (Oral)   Ht 6\' 1"  (1.854 m)   Wt 276 lb (125.2 kg)   BMI 36.41 kg/m  Gen: NAD, alert, cooperative with exam HEENT: NCAT, Nasal passages swollen, red Max sinuses teder to percussion. TMs nml CV: RRR, good S1/S2, no murmur Resp: Bronchitis changes with scattered wheezes, non-labored Ext: No edema, warm Neuro: Alert and oriented, No gross deficits  Assessment and plan:  1. Sinobronchitis   2. Body aches     Meds ordered this encounter  Medications  . levofloxacin (LEVAQUIN) 500 MG tablet    Sig: Take 1 tablet (500 mg total) by mouth daily. For 10 days    Dispense:  10 tablet    Refill:  0  . betamethasone acetate-betamethasone sodium phosphate (CELESTONE) injection 6 mg    Orders Placed This Encounter  Procedures  . Veritor Flu A/B Waived    Order Specific Question:   Source    Answer:   nasal    Follow up as needed.  Mechele ClaudeWarren Kameryn Tisdel, MD

## 2017-05-17 ENCOUNTER — Telehealth: Payer: Self-pay | Admitting: Family Medicine

## 2017-05-17 MED ORDER — ALBUTEROL SULFATE HFA 108 (90 BASE) MCG/ACT IN AERS: 2.0000 | INHALATION_SPRAY | Freq: Four times a day (QID) | RESPIRATORY_TRACT | 0 refills | Status: AC | PRN

## 2017-05-17 NOTE — Telephone Encounter (Signed)
Aware that inhaler has been seen to pharmacy and will need to call back for any worsening of symptoms.  Per Dr. Darlyn ReadStacks. Patient verbalized understanding.

## 2017-05-17 NOTE — Telephone Encounter (Signed)
Please send albuterol inhaler 2 puffs 4 times daily as needed shortness of breath and wheezing.  #1 no refills  However if he is starting to have more shortness of breath he should follow-up here  Thanks, WS

## 2017-05-18 NOTE — Telephone Encounter (Signed)
Patient was seen by Dr. Darlyn ReadStacks on 05/12/17

## 2017-05-25 ENCOUNTER — Other Ambulatory Visit: Payer: Self-pay | Admitting: Family Medicine

## 2017-05-25 MED ORDER — CETIRIZINE HCL 10 MG PO TABS: 10.0000 mg | ORAL_TABLET | Freq: Every day | ORAL | 11 refills | Status: AC

## 2017-05-25 NOTE — Telephone Encounter (Signed)
Refill on zyrtec sent into the pharmacy per pt request and pt is aware.

## 2021-12-08 NOTE — Progress Notes (Signed)
Formatting of this note is different from the original.    Patient ID: Javier James is a 56 y.o. male.    Chief Complaint   Patient presents with    overactive bladder     Pt is requesting a referral to Urologist.     Irregular Heart Beat     Pt will need referral to Cardiologist. Carvedilol has helped with the heart flutters.     Obesity     Pt states he can't stop gaining weight even with eating better and exercise.     HPI  Patient states he wants referral to urologist for overactive bladder and to cardiologist for palpitations.    "Depression: doing well on current medications. Feels that depressive symptoms are controlled. No mood swings, anxiety, no thoughts of harm to self or to others."     Patient states he is concerned about his weight.    Review of Systems    Problem list, past medical history (within the problem list), medications reviewed every visit:    There is no problem list on file for this patient.    Current Outpatient Medications on File Prior to Visit   Medication Sig Dispense Refill    carvediloL (use for COREG) 3.125 mg tablet Take 1 tablet by mouth in the morning and 1 tablet in the evening.      diclofenac (use for VOLTAREN) 1 % gel Apply 4 g topically every 8 (eight) hours      lamoTRIgine (use for LaMICtal) 150 mg tablet Take 1 tablet by mouth in the morning.      lithium carbonate 300 mg capsule Take 600 mg by mouth      lurasidone (use for LATUDA) 80 mg tablet Take 80 mg by mouth   TAKE ONE TABLET BY MOUTH EVERY EVENING FOR BIPOLAR DEPRESSION TAKE WITH AT LEAST 350 CALORIES OF FOOD FOR THIS MEDICATION TO WORK BEST      mirtazapine (use for REMERON) 15 mg tablet Take 7.5 mg by mouth nightly      sertraline (use for ZOLOFT) 100 mg tablet Take 100 mg by mouth in the morning.      SUMAtriptan (use for IMITREX) 50 mg tablet Take 50 mg by mouth  TAKE ONE TABLET BY MOUTH EVERY DAY AS NEEDED FOR MIGRAINE HEADACHE - MAY REPEAT AFTER 2 HOURS IF NO RELIEF - MAX DOSE 200MG  PER 24 HOURS        No current facility-administered medications on file prior to visit.     Visit Vitals  BP 120/78 (BP Cuff Site Location: Left arm, Patient Position: Sitting, BP Cuff Size : Large)   Pulse 79   Temp 98.3 F (36.8 C) (Forehead)   Ht 6' (1.829 m)   Wt 134 kg (294 lb 9.6 oz)   SpO2 96%   BMI 39.95 kg/m     Physical Exam  Constitutional:       Appearance: Normal appearance.   HENT:      Head: Normocephalic and atraumatic.   Cardiovascular:      Rate and Rhythm: Normal rate and regular rhythm.      Pulses: Normal pulses.      Heart sounds: Normal heart sounds.   Musculoskeletal:      Cervical back: Normal range of motion and neck supple.   Neurological:      Mental Status: He is alert.   Pulmonary:      Effort: Pulmonary effort is normal.      Breath  sounds: Normal breath sounds.      Diagnosis Plan   1. Overactive bladder  Ambulatory referral to Urology     2. Palpitations  Ambulatory referral to Cardiology     3. Overweight  Ambulatory referral to Nutrition Services       1 I referred to urologist  2 I referred to cardiologist  3 I referred to nutritionist    Assessment/Plan     Recommendations:    No follow-ups on file.       Erin Sons, MD  Electronically signed by Erin Sons., MD at 12/08/2021  9:26 AM EDT

## 2022-01-06 ENCOUNTER — Encounter

## 2022-01-06 ENCOUNTER — Institutional Professional Consult (permissible substitution): Admit: 2022-01-06 | Discharge: 2022-01-06 | Payer: TRICARE (CHAMPUS)

## 2022-01-06 DIAGNOSIS — R072 Precordial pain: Secondary | ICD-10-CM

## 2022-01-06 MED ORDER — METOPROLOL TARTRATE 50 MG PO TABS
50 MG | ORAL_TABLET | Freq: Two times a day (BID) | ORAL | 1 refills | Status: DC
Start: 2022-01-06 — End: 2022-07-01

## 2022-01-06 NOTE — Progress Notes (Signed)
[facility]  [address], [address]  [place] [address]  PHONE: [phone]      01/06/22    NAME:  Javier James  DOB: [birth year]  MRN: [id number]         SUBJECTIVE:   Javier James is a 56 y.o. male seen for a consultation visit regarding the following:     Chief Complaint   Patient presents with    Consultation     Palpitations            HPI:  Consultation is requested by No primary care provider on file. for evaluation of Consultation (Palpitations)   .    Javier James presents today for follow-up.  Patient was referred for evaluation of chest pain and palpitations.  He describes chest discomfort this been going on for several years.  Started right before he retired from [employer] in 2013.  He notes he has episodes of heart pressure as well as fluttering with some extension up into the neck.  Said he had a work-up in [place] at the [place] there about 18 months ago including a stress test and echocardiogram which were normal.  He was started on low-dose carvedilol.  Symptoms have continued since then.  He is quite concerned about this.  He is a former smoker who quit about 10 years ago.  His father had an MI.  His mother had heart disease as well.  He is been on the carvedilol but no other antihypertensive medications.  He is on no cholesterol therapy.  Recent lipid panel showed an LDL of 117.        Key CAD CHF Meds            metoprolol tartrate (LOPRESSOR) 50 MG tablet (Taking)    Sig - Route: Take 1 tablet by mouth 2 times daily - Oral          Key Antihyperglycemic Medications       Patient is on no antihyperglycemic meds.              Past Medical History, Past Surgical History, Family history, Social History, and Medications were all reviewed with the patient today and updated as necessary.     Prior to Admission medications    Medication Sig Start Date End Date Taking? Authorizing Provider   lamoTRIgine (LAMICTAL) 150 MG tablet Take 1 tablet by mouth daily 02/19/21  Yes  [provider]   lithium 300 MG capsule Take 2 capsules by mouth 10/01/21  Yes [provider]   mirtazapine (REMERON) 15 MG tablet Take 0.5 tablets by mouth nightly 10/01/21  Yes [provider]   sertraline (ZOLOFT) 100 MG tablet Take 1 tablet by mouth daily 02/19/21  Yes [provider]   lurasidone (LATUDA) 80 MG TABS tablet Take 1 tablet by mouth 04/23/21  Yes [provider]   busPIRone (BUSPAR) 10 MG tablet Take 1 tablet by mouth 3 times daily   Yes [provider]   clonazePAM (KLONOPIN) 0.5 MG tablet Take 1 tablet by mouth 2 times daily as needed. Max Daily Amount: 1 mg   Yes [provider]   b complex vitamins capsule Take 1 capsule by mouth daily   Yes [provider]   ZINC PO Take by mouth   Yes [provider]   Ascorbic Acid (VITAMIN C PO) Take by mouth   Yes [provider]   VITAMIN D PO Take by mouth   Yes [provider]   metoprolol tartrate (LOPRESSOR) 50 MG tablet Take 1 tablet by mouth 2 times daily 01/06/22  Yes Manuela Neptune, MD     Allergies   Allergen Reactions    Lisinopril      Other Reaction(s): Cough    Penicillins Rash     Other Reaction(s): Pharyngeal spasm, Unknown    Topiramate Rash     History reviewed. No pertinent past medical history.  History reviewed. No pertinent surgical history.  History reviewed. No pertinent family history.  Social History     Tobacco Use    Smoking status: Former     Types: Cigarettes     Start date: 1996     Quit date: 2010     Years since quitting: 13.8     Passive exposure: Past    Smokeless tobacco: Never   Substance Use Topics    Alcohol use: Not on file       ROS:    Review of Systems   Constitutional: Negative for chills, diaphoresis and fever.   HENT:  Negative for hearing loss.    Eyes:  Negative for visual disturbance.   Cardiovascular:  Negative for chest pain, claudication, dyspnea on exertion, leg swelling, near-syncope, orthopnea,  palpitations and paroxysmal nocturnal dyspnea.   Respiratory:  Negative for shortness of breath.    Hematologic/Lymphatic: Does not bruise/bleed easily.   Gastrointestinal:  Negative for abdominal pain.   Genitourinary:  Negative for dysuria.   Neurological:  Negative for focal weakness.   Psychiatric/Behavioral:  Negative for suicidal ideas.           PHYSICAL EXAM:   BP 122/86   Pulse 87   Ht 1.88 m (6\' 2" )   Wt 135.6 kg (299 lb)   BMI 38.39 kg/m      Physical Exam  Vitals reviewed.   Constitutional:       General: He is not in acute distress.     Appearance: Normal appearance.   HENT:      Head: Normocephalic and atraumatic.   Eyes:      General: No scleral icterus.  Neck:      Vascular: No carotid bruit.   Cardiovascular:      Rate and Rhythm: Normal rate and regular rhythm.      Heart sounds: No murmur heard.  Pulmonary:      Breath sounds: Normal breath sounds.   Abdominal:      General: Abdomen is flat.      Palpations: Abdomen is soft.   Musculoskeletal:         General: No swelling.      Cervical back: Neck supple.   Skin:     General: Skin is warm and dry.   Neurological:      Mental Status: He is alert and oriented to person, place, and time.   Psychiatric:         Mood and Affect: Mood normal.         Medical problems and test results were reviewed with the patient today.     DATA REVIEW    BMP  No results found for: "NA", "K", "CL", "CO2", "BUN", "CREATININE", "GLUCOSE", "CALCIUM"     LIPIDS  No results found for: "CHOL"  No results found for: "TRIG"  No results found for: "HDL"  No results found for: "LDLCHOLESTEROL", "LDLCALC"  No results found for: "LABVLDL", "VLDL"  No results found for: "CHOLHDLRATIO"        OUTSIDE  RECORDS REVIEW    Records from outside providers have been reviewed and summarized as noted in the HPI, past history and data review sections of this note, and reviewed with patient. .       ASSESSMENT and PLAN    Javier James was seen today for consultation.    Diagnoses and all orders  for this visit:    Precordial chest pain  -     metoprolol tartrate (LOPRESSOR) 50 MG tablet; Take 1 tablet by mouth 2 times daily  -     CTA CORON EJECT FRAC WALL MOTION; Future    Encounter to establish care  -     EKG 12 lead          IMPRESSION:    Javier James presents today for follow-up.  He complains of chest discomfort and palpitations going on for several years.  Had a work-up 18 months ago including echo and stress test which were normal.  His EKG shows left anterior fascicular block but no other significant abnormalities.  His blood pressure is at goal.  We will transition his beta-blocker to carvedilol so that we can advance the dose for heart rate control.  We will have him undergo a coronary CT scan.  We will follow-up after this test    Return in about 2 months (around 03/08/2022).            Thank you for allowing me to participate in this patient's care.  Please call or contact me if there are any questions or concerns regarding the above.      Rodney Booze III, MD  01/06/22  3:25 PM

## 2022-01-22 ENCOUNTER — Encounter

## 2022-01-26 ENCOUNTER — Inpatient Hospital Stay: Admit: 2022-01-26 | Discharge: 2022-01-30 | Payer: TRICARE (CHAMPUS) | Primary: Family Medicine

## 2022-01-26 DIAGNOSIS — R072 Precordial pain: Secondary | ICD-10-CM

## 2022-01-26 LAB — POCT CREATININE - BLOOD
POC Creatinine: 0.7 mg/dL — ABNORMAL LOW (ref 0.8–1.5)
eGFR, POC: >60 mL/min/{1.73_m2} (ref >60)

## 2022-01-26 MED ORDER — IOPAMIDOL 76 % IV SOLN
76 % | Freq: Once | INTRAVENOUS | Status: AC | PRN
Start: 2022-01-26 — End: 2022-01-26
  Administered 2022-01-26: 13:00:00 75 mL via INTRAVENOUS

## 2022-01-26 MED ORDER — NITROGLYCERIN 0.4 MG SL SUBL
0.4 MG | Freq: Once | SUBLINGUAL | Status: AC
Start: 2022-01-26 — End: 2022-01-26
  Administered 2022-01-26: 13:00:00 0.8 mg via SUBLINGUAL

## 2022-01-27 NOTE — Telephone Encounter (Signed)
Pt would like a call back

## 2022-01-27 NOTE — Telephone Encounter (Signed)
-----   Message from [place], MD sent at 01/27/2022 11:38 AM EST -----  Please let patient know that there coronary CT scan was abnormal. Please schedule for appointment in the next 1 to 2 weeks to discuss neck steps.

## 2022-01-27 NOTE — Telephone Encounter (Signed)
Tried calling patient to review CT results and unable to leave a message VMB is full.

## 2022-01-27 NOTE — Telephone Encounter (Signed)
Pt is Returning  Nurse call ,Please call pt asap

## 2022-01-28 NOTE — Telephone Encounter (Signed)
Called patient back and reviewed results and scheduled follow up.

## 2022-01-28 NOTE — Telephone Encounter (Signed)
Spoke to patient and reviewed results. All questions and concerns addressed.  Scheduled follow up per Dr. Mee Hives.

## 2022-02-06 ENCOUNTER — Ambulatory Visit: Admit: 2022-02-06 | Discharge: 2022-02-06 | Payer: TRICARE (CHAMPUS) | Primary: Family Medicine

## 2022-02-06 DIAGNOSIS — R072 Precordial pain: Secondary | ICD-10-CM

## 2022-02-06 NOTE — Progress Notes (Signed)
Formatting of this note is different from the original.    Patient ID: Javier James is a 56 y.o. male.    Chief Complaint   Patient presents with    Follow-up     Pt's Cardiologist has changed his meds. I have updated in computer. Pt would also like an A1c checked on him.     Rash     Pt states he needs Nystatin powder or cream for the yeast infection in his groin region.      HPI  Patient states he wants to obtain hemoglobin A1c.  Hypertension: recheck of HTN. Doing well. Remains compliant with medications. No chest pain, SOB, edema or dizziness. States that normotensive at home.     "Depression: doing well on current medications. Feels that depressive symptoms are controlled. No mood swings, anxiety, no thoughts of harm to self or to others."     Anxiety: stable on current regimen. Remains compliant with medications and visits. No agitation or mood swings. No thoughts of harm to self or to others. Does not use with alcohol or sedating medications    Patient states he has not taken phentermine.    Patient states he wants nystatin.  Review of Systems    Problem list, past medical history (within the problem list), medications reviewed every visit:    There is no problem list on file for this patient.    Current Outpatient Medications on File Prior to Visit   Medication Sig Dispense Refill    busPIRone (use for BUSPAR) 10 mg tablet Take 10 mg by mouth in the morning and 10 mg at noon and 10 mg in the evening.      clonazePAM (use for KlonoPIN) 0.5 mg tablet Take 1 tablet by mouth daily as needed for anxiety      diclofenac (use for VOLTAREN) 1 % gel Apply 4 g topically every 8 (eight) hours      lamoTRIgine (use for LaMICtal) 150 mg tablet Take 1 tablet by mouth in the morning.      lithium carbonate 300 mg capsule Take 600 mg by mouth      lurasidone (use for LATUDA) 80 mg tablet Take 80 mg by mouth   TAKE ONE TABLET BY MOUTH EVERY EVENING FOR BIPOLAR DEPRESSION TAKE WITH AT LEAST 350 CALORIES OF FOOD FOR  THIS MEDICATION TO WORK BEST      metoprolol tartrate (use for LOPRESSOR) 50 mg tablet Take 1 tablet by mouth in the morning and 1 tablet in the evening.      mirtazapine (use for REMERON) 15 mg tablet Take 7.5 mg by mouth nightly      sertraline (use for ZOLOFT) 100 mg tablet Take 100 mg by mouth in the morning.      SUMAtriptan (use for IMITREX) 50 mg tablet Take 50 mg by mouth  TAKE ONE TABLET BY MOUTH EVERY DAY AS NEEDED FOR MIGRAINE HEADACHE - MAY REPEAT AFTER 2 HOURS IF NO RELIEF - MAX DOSE 200MG  PER 24 HOURS       No current facility-administered medications on file prior to visit.     Visit Vitals  BP 122/78 (BP Cuff Site Location: Left arm, Patient Position: Sitting, BP Cuff Size : Large)   Pulse 65   Temp 98.1 F (36.7 C) (Forehead)   Wt 134 kg (294 lb 12.8 oz)   SpO2 96%   BMI 39.98 kg/m     Physical Exam  Constitutional:  Appearance: Normal appearance.   HENT:      Head: Normocephalic and atraumatic.   Cardiovascular:      Rate and Rhythm: Normal rate and regular rhythm.      Pulses: Normal pulses.      Heart sounds: Normal heart sounds.   Musculoskeletal:      Cervical back: Normal range of motion and neck supple.   Neurological:      Mental Status: He is alert.   Pulmonary:      Effort: Pulmonary effort is normal.      Breath sounds: Normal breath sounds.     Assessment/Plan   Javier James was seen today for follow-up and rash.    Diagnoses and associated orders for this visit:    #1 Primary hypertension (Primary)  -     Hemoglobin A1c; Future    #2 Depression, unspecified depression type    #3 Anxiety    #4 Tinea cruris  -     nystatin (use for MYCOSTATIN) ointment; Apply 1 Application topically in the morning and 1 Application in the evening. Application site: apply bid to groin area.  Dispense: 30 g; Refill: 0    1 I said to continue current regimen  2 I said to continue current regimen  3 I said to continue current regimen  4 I provided nystatin  Recommendations:    No follow-ups on file.        Erin Sons, MD  Electronically signed by Erin Sons., MD at 02/06/2022  9:30 AM EST

## 2022-02-06 NOTE — Progress Notes (Signed)
[facility]  [address], [address]  [place] [address]  PHONE: [phone]      02/06/22    NAME:  Javier James  DOB: Oct 22, 1965  MRN: [id number]         SUBJECTIVE:   Javier James is a 56 y.o. male seen for a follow up visit regarding the following:     Chief Complaint   Patient presents with    Results     CT     Chest Pain            HPI:  Follow up  Results (CT ) and Chest Pain   .    Javier James presents today for follow-up.  Coronary CT scan showed probable severe stenosis of the LAD at the bifurcation of the first diagonal branch.  He is continue to have ongoing chest pain.  No new complaints or issues today.    Chest Pain   Pertinent negatives include no abdominal pain, claudication, diaphoresis, fever, near-syncope, orthopnea, palpitations, PND or shortness of breath.           Key CAD CHF Meds            metoprolol tartrate (LOPRESSOR) 50 MG tablet (Taking)    Sig - Route: Take 1 tablet by mouth 2 times daily - Oral          Key Antihyperglycemic Medications       Patient is on no antihyperglycemic meds.                Past Medical History, Past Surgical History, Family history, Social History, and Medications were all reviewed with the patient today and updated as necessary.     Prior to Admission medications    Medication Sig Start Date End Date Taking? Authorizing Provider   lamoTRIgine (LAMICTAL) 150 MG tablet Take 1 tablet by mouth daily 02/19/21  Yes [provider]   lithium 300 MG capsule Take 2 capsules by mouth 10/01/21  Yes [provider]   mirtazapine (REMERON) 15 MG tablet Take 0.5 tablets by mouth nightly 10/01/21  Yes [provider]   sertraline (ZOLOFT) 100 MG tablet Take 1 tablet by mouth daily 02/19/21  Yes [provider]   lurasidone (LATUDA) 80 MG TABS tablet Take 1 tablet by mouth 04/23/21  Yes [provider]   busPIRone (BUSPAR) 10 MG tablet Take 1 tablet by mouth 3 times daily   Yes  [provider]   clonazePAM (KLONOPIN) 0.5 MG tablet Take 1 tablet by mouth 2 times daily as needed.   Yes [provider]   b complex vitamins capsule Take 1 capsule by mouth daily   Yes [provider]   ZINC PO Take by mouth   Yes [provider]   Ascorbic Acid (VITAMIN C PO) Take by mouth   Yes [provider]   VITAMIN D PO Take by mouth   Yes [provider]   metoprolol tartrate (LOPRESSOR) 50 MG tablet Take 1 tablet by mouth 2 times daily 01/06/22  Yes Donzetta Matters, MD     Allergies   Allergen Reactions    Lisinopril      Other Reaction(s): Cough    Penicillins Rash     Other Reaction(s): Pharyngeal spasm, Unknown    Topiramate Rash     No past medical history on file.  No past surgical history on file.  No family history on file.  Social  History     Tobacco Use    Smoking status: Former     Types: Cigarettes     Start date: 1996     Quit date: 2010     Years since quitting: 13.9     Passive exposure: Past    Smokeless tobacco: Never   Substance Use Topics    Alcohol use: Not on file       ROS:    Review of Systems   Constitutional: Negative for chills, diaphoresis and fever.   HENT:  Negative for hearing loss.    Eyes:  Negative for visual disturbance.   Cardiovascular:  Positive for chest pain. Negative for claudication, dyspnea on exertion, leg swelling, near-syncope, orthopnea, palpitations and paroxysmal nocturnal dyspnea.   Respiratory:  Negative for shortness of breath.    Hematologic/Lymphatic: Does not bruise/bleed easily.   Gastrointestinal:  Negative for abdominal pain.   Genitourinary:  Negative for dysuria.   Neurological:  Negative for focal weakness.   Psychiatric/Behavioral:  Negative for suicidal ideas.           PHYSICAL EXAM:   BP 122/60   Pulse 79   Ht 1.88 m (6\' 2" )   Wt (!) 136.5 kg (301 lb)   SpO2 95%   BMI 38.65 kg/m      Wt Readings from Last 3 Encounters:   02/06/22 (!) 136.5 kg (301 lb)   01/06/22 135.6 kg (299  lb)     BP Readings from Last 3 Encounters:   02/06/22 122/60   01/26/22 125/79   01/06/22 122/86     Pulse Readings from Last 3 Encounters:   02/06/22 79   01/26/22 71   01/06/22 87           Physical Exam  Vitals reviewed.   Constitutional:       General: He is not in acute distress.     Appearance: Normal appearance.   HENT:      Head: Normocephalic and atraumatic.   Eyes:      General: No scleral icterus.  Neck:      Vascular: No carotid bruit.   Cardiovascular:      Rate and Rhythm: Normal rate and regular rhythm.      Heart sounds: No murmur heard.  Pulmonary:      Breath sounds: Normal breath sounds.   Abdominal:      General: Abdomen is flat.      Palpations: Abdomen is soft.   Musculoskeletal:         General: No swelling.      Cervical back: Neck supple.   Skin:     General: Skin is warm and dry.   Neurological:      Mental Status: He is alert and oriented to person, place, and time.   Psychiatric:         Mood and Affect: Mood normal.         Medical problems and test results were reviewed with the patient today.     DATA REVIEW    LIPID PANEL     No results found for: "CHOL"  No results found for: "TRIG"  No results found for: "HDL"  No results found for: "LDLCHOLESTEROL", "LDLCALC"  No results found for: "LABVLDL", "VLDL"  No results found for: "CHOLHDLRATIO"    BMP  Lab Results   Component Value Date/Time    CREATININE 0.70 01/26/2022 07:44 AM          EKG  CXR/IMAGING        DEVICE INTERROGATION        OUTSIDE RECORDS REVIEW    Records from outside providers have been reviewed and summarized as noted in the HPI, past history and data review sections of this note, and reviewed with patient. .       ASSESSMENT and PLAN    Javier James was seen today for results and chest pain.    Diagnoses and all orders for this visit:    Precordial chest pain  -     Comprehensive Metabolic Panel; Future  -     CBC; Future  -     Case Request Cardiac Cath Lab          IMPRESSION:    Javier James presents today for  follow-up.  We reviewed his coronary CT scan which showed a probable significant stenosis of the LAD at the bifurcation of diagonal branch.  Given his ongoing chest pain and CT findings, I recommended a heart catheterization to better define his coronary and me and possible PCI depending severity of the lesion.  We discussed procedure in detail including risk and benefits and patient is amenable to proceed.  We will repeat his metabolic panel and CBC.  We will schedule this At his earliest convenience.        No follow-ups on file.          Thank you for allowing me to participate in this patient's care.  Please call or contact me if there are any questions or concerns regarding the above.      Rodney Booze III, MD  02/06/22  4:04 PM

## 2022-02-09 ENCOUNTER — Encounter

## 2022-02-09 LAB — CBC
Hematocrit: 48.8 % (ref 41.1–50.3)
Hemoglobin: 15.7 g/dL (ref 13.6–17.2)
MCH: 28.6 PG (ref 26.1–32.9)
MCHC: 32.2 g/dL (ref 31.4–35.0)
MCV: 89.1 FL (ref 82–102)
MPV: 10.9 FL (ref 9.4–12.3)
Platelets: 284 10*3/uL (ref 150–450)
RBC: 5.48 M/uL (ref 4.23–5.6)
RDW: 12.4 % (ref 11.9–14.6)
WBC: 8.8 10*3/uL (ref 4.3–11.1)
nRBC: 0 10*3/uL (ref 0.0–0.2)

## 2022-02-10 LAB — COMPREHENSIVE METABOLIC PANEL
ALT: 77 U/L — ABNORMAL HIGH (ref 12–65)
AST: 44 U/L — ABNORMAL HIGH (ref 15–37)
Albumin/Globulin Ratio: 1.1 (ref 0.4–1.6)
Albumin: 4 g/dL (ref 3.5–5.0)
Alk Phosphatase: 81 U/L (ref 50–136)
Anion Gap: 6 mmol/L (ref 2–11)
BUN: 13 MG/DL (ref 6–23)
CO2: 28 mmol/L (ref 21–32)
Calcium: 9.7 MG/DL (ref 8.3–10.4)
Chloride: 105 mmol/L (ref 101–110)
Creatinine: 1.1 MG/DL (ref 0.8–1.5)
Est, Glom Filt Rate: >60 mL/min/{1.73_m2} (ref >60)
Globulin: 3.6 g/dL (ref 2.8–4.5)
Glucose: 186 mg/dL — ABNORMAL HIGH (ref 65–100)
Potassium: 4.3 mmol/L (ref 3.5–5.1)
Sodium: 139 mmol/L (ref 133–143)
Total Bilirubin: 1 MG/DL (ref 0.2–1.1)
Total Protein: 7.6 g/dL (ref 6.3–8.2)

## 2022-02-11 NOTE — Progress Notes (Signed)
Patient pre-assessment complete for LHC scheduled for 02/12/22, arrival time 0600. Patient verified using DOB. Patient instructed to bring a list of all home medications on the day of procedure. NPO status reinforced. Patient informed to take a full dose aspirin 325mg  or 81 mg x 4 on the day of procedure. Instructed they can take all other medications excluding vitamins & supplements. Patient verbalizes understanding of all instructions & denies any questions at this time.

## 2022-02-12 ENCOUNTER — Inpatient Hospital Stay: Payer: TRICARE (CHAMPUS) | Attending: Cardiovascular Disease

## 2022-02-12 LAB — EKG 12-LEAD
Atrial Rate: 60 {beats}/min
P Axis: 19 degrees
P-R Interval: 234 ms
Q-T Interval: 432 ms
QRS Duration: 88 ms
QTc Calculation (Bazett): 432 ms
R Axis: -5 degrees
T Axis: 36 degrees
Ventricular Rate: 60 {beats}/min

## 2022-02-12 LAB — CARDIAC PROCEDURE: Body Surface Area: 2.67 m2

## 2022-02-12 MED ORDER — HEPARIN (PORCINE) IN NACL 2000-0.9 UNIT/L-% IV SOLN
INTRAVENOUS | Status: DC | PRN
Start: 2022-02-12 — End: 2022-02-12
  Administered 2022-02-12: 14:00:00 3 via INTRAVENOUS

## 2022-02-12 MED ORDER — ASPIRIN 81 MG PO CHEW
81 MG | Freq: Once | ORAL | Status: DC
Start: 2022-02-12 — End: 2022-02-12

## 2022-02-12 MED ORDER — VERAPAMIL HCL 2.5 MG/ML IV SOLN
2.5 MG/ML | INTRAVENOUS | Status: AC
Start: 2022-02-12 — End: ?

## 2022-02-12 MED ORDER — NITROGLYCERIN IN D5W 200-5 MCG/ML-% IV SOLN
200-5 MCG/ML-% | INTRAVENOUS | Status: AC
Start: 2022-02-12 — End: ?

## 2022-02-12 MED ORDER — VERAPAMIL HCL 2.5 MG/ML IV SOLN
2.5 MG/ML | INTRAVENOUS | Status: DC | PRN
Start: 2022-02-12 — End: 2022-02-12
  Administered 2022-02-12: 14:00:00 2.3 via INTRA_ARTERIAL

## 2022-02-12 MED ORDER — HEPARIN (PORCINE) IN NACL 2000-0.9 UNIT/L-% IV SOLN
INTRAVENOUS | Status: AC
Start: 2022-02-12 — End: ?

## 2022-02-12 MED ORDER — LIDOCAINE HCL 1 % IJ SOLN
1 % | INTRAMUSCULAR | Status: DC | PRN
Start: 2022-02-12 — End: 2022-02-12
  Administered 2022-02-12: 14:00:00 2 via INTRADERMAL

## 2022-02-12 MED ORDER — SODIUM CHLORIDE 0.9 % IV SOLN
0.9 % | INTRAVENOUS | Status: AC
Start: 2022-02-12 — End: ?

## 2022-02-12 MED ORDER — FENTANYL CITRATE (PF) 100 MCG/2ML IJ SOLN
100 MCG/2ML | INTRAMUSCULAR | Status: AC
Start: 2022-02-12 — End: ?

## 2022-02-12 MED ORDER — MIDAZOLAM HCL 5 MG/5ML IJ SOLN
5 MG/ML | INTRAMUSCULAR | Status: DC | PRN
Start: 2022-02-12 — End: 2022-02-12
  Administered 2022-02-12: 14:00:00 2 via INTRAVENOUS

## 2022-02-12 MED ORDER — LIDOCAINE HCL 1 % IJ SOLN
1 % | INTRAMUSCULAR | Status: AC
Start: 2022-02-12 — End: ?

## 2022-02-12 MED ORDER — IOPAMIDOL 76 % IV SOLN
76 % | INTRAVENOUS | Status: DC | PRN
Start: 2022-02-12 — End: 2022-02-12
  Administered 2022-02-12: 14:00:00 100 via INTRA_ARTERIAL

## 2022-02-12 MED ORDER — FENTANYL 0.05 MG/ML SOLN (MIXTURES ONLY)
Status: DC | PRN
Start: 2022-02-12 — End: 2022-02-12
  Administered 2022-02-12: 14:00:00 25 via INTRAVENOUS

## 2022-02-12 MED ORDER — MIDAZOLAM HCL 2 MG/2ML IJ SOLN
2 MG/ML | INTRAMUSCULAR | Status: AC
Start: 2022-02-12 — End: ?

## 2022-02-12 MED ORDER — SODIUM CHLORIDE 0.9 % IV SOLN
0.9 % | INTRAVENOUS | Status: DC
Start: 2022-02-12 — End: 2022-02-12
  Administered 2022-02-12: 12:00:00 via INTRAVENOUS

## 2022-02-12 MED FILL — LIDOCAINE HCL 1 % IJ SOLN: 1 % | INTRAMUSCULAR | Qty: 20

## 2022-02-12 MED FILL — HEPARIN (PORCINE) IN NACL 2000-0.9 UNIT/L-% IV SOLN: INTRAVENOUS | Qty: 3000

## 2022-02-12 MED FILL — NITROGLYCERIN IN D5W 200-5 MCG/ML-% IV SOLN: 200-5 MCG/ML-% | INTRAVENOUS | Qty: 8

## 2022-02-12 MED FILL — VERAPAMIL HCL 2.5 MG/ML IV SOLN: 2.5 MG/ML | INTRAVENOUS | Qty: 4

## 2022-02-12 MED FILL — FENTANYL CITRATE (PF) 100 MCG/2ML IJ SOLN: 100 MCG/2ML | INTRAMUSCULAR | Qty: 2

## 2022-02-12 MED FILL — SODIUM CHLORIDE 0.9 % IV SOLN: 0.9 % | INTRAVENOUS | Qty: 50

## 2022-02-12 MED FILL — MIDAZOLAM HCL 2 MG/2ML IJ SOLN: 2 MG/ML | INTRAMUSCULAR | Qty: 2

## 2022-02-12 NOTE — Progress Notes (Signed)
Patient received to [facility] room # 10.  Ambulatory from lobby. Patient scheduled for LHC today with Dr Gracelyn Nurse. Procedure reviewed & questions answered, voiced good understanding consent obtained & placed on chart. All medications and medical history reviewed. Will prep patient per orders. Patient & family updated on plan of care.      The patient has a fraility score of 3-MANAGING WELL, based on age and ability to perform ADLs.    Patient took 324mg  ASA at 0445 prior to arrival.

## 2022-02-12 NOTE — Progress Notes (Signed)
Report received from [facility]. Procedural finding communicated. Intra procedural medication administration reviewed. Progression of care discussed.    Patient received into [facility] room 1, Post LHC    Access site without bleeding or swelling. Right wrist    Patient instructed to limit movement of right extremity.    Routine post procedural vital signs & site assessment initiated.

## 2022-02-12 NOTE — Discharge Instructions (Signed)
HEART CATHETERIZATION/ANGIOGRAPHY DISCHARGE INSTRUCTIONS    Check puncture site frequently for swelling or bleeding. If there is any bleeding, apply pressure over the area with a clean towel or washcloth and call 911. Notify your doctor for any redness, swelling, drainage, or oozing from the puncture site. Notify your doctor for any fever or chills.  If the extremity becomes cold, numb, or painful call [facility] at [phone].  Activity should be limited for the next 48 hours. No heavy lifting, pushing, pulling  or strenuous activity for 48 hours. No heavy lifting (anything over 10 pounds) for 3 days.  You may resume your usual diet. Drink more fluids than usual.  Have a responsible person drive you home and stay with you for at least 24 hours after your heart catheterization/angiography.  You may remove bandage from your RIGHT WRIST in 24 hours. You may shower in 24 hours. No tub baths, hot tubs, or swimming for 1 week. Do not place any lotions, creams, powders, or ointments over puncture site for 1 week. You may place a clean band-aid over the puncture site each day for 5 days. Change daily.        Sedation for a Medical Procedure: Care Instructions     You were given a sedative medication during your visit. While many of the effects will have worn   off before you leave; you may continue to feel some effects for several hours.      Common side effects from sedation include:  Feeling sleepy. (Your doctors and nurses will make sure you are not too sleepy to go home.)  Nausea and vomiting. This usually does not last long.  Feeling tired.     How can you care for yourself at home?  Activity    Don't do anything for 24 hours that requires attention to detail. It takes time for the medicine effects to completely wear off.     Do not make important legal decisions for 24 hours.     Do not sign any legal documents for 24 hours.     Do not drink alcohol today     For your safety, you should not drive or operate  heavy machinery for the remainder of the day     Rest when you feel tired. Getting enough sleep will help you recover.      Diet    You can eat your normal diet, unless your doctor gives you other instructions. If your stomach is upset, try clear liquids and bland, low-fat foods like plain toast or rice.     Drink plenty of fluids (unless your doctor tells you not to).     Don't drink alcohol for 24 hours.      Medicines    Be safe with medicines. Read and follow all instructions on the label.  If the doctor gave you a prescription medicine for pain, take it as prescribed.  If you are not taking a prescription pain medicine, ask your doctor if you can take an over-the-counter medicine.     If you think your pain medicine is making you sick to your stomach:  Take your medicine after meals (unless your doctor has told you not to).  Ask your doctor for a different pain medicine.       I have read the above instructions and have had the opportunity to ask questions.      Patient: ________________________   Date: _____________    Witness: _______________________   Date: _____________

## 2022-02-12 NOTE — Progress Notes (Signed)
TRANSFER - OUT REPORT:    LHC with Dr. Gracelyn Nurse  Access: Right radial   No intervention    Tr band applied to right radial with 12 ml in band  No bleeding or hematoma site soft    MAR  2 mg versed  25 mcg Fentanyl   5,000 units  heparin     Verbal report given to Jeani Hawking on Mikhi Athey  being transferred to [facility] for routine progression of patient care       Report consisted of patient's Situation, Background, Assessment and Recommendations(SBAR).     Information from the following report(s) Nurse Handoff Report and MAR was reviewed with the receiving nurse.    Opportunity for questions and clarification was provided.      Patient transported with:  Registered Nurse

## 2022-02-12 NOTE — Progress Notes (Signed)
Discharge instructions given. TR band removed from right wrist with no bleeding or swelling to site. 4x4 gauze and tegaderm applied to site with no complaints.

## 2022-02-20 NOTE — Progress Notes (Signed)
Formatting of this note is different from the original.    Patient ID: Javier James is a 56 y.o. male.    Chief Complaint   Patient presents with    Follow-up     Patient here for a follow up for A1C.     HPI    Patient states he is willing to take metformin to lower blood sugar.  Hypertension: recheck of HTN. Doing well. Remains compliant with medications. No chest pain, SOB, edema or dizziness. States that normotensive at home.     "Depression: doing well on current medications. Feels that depressive symptoms are controlled. No mood swings, anxiety, no thoughts of harm to self or to others."     Anxiety: stable on current regimen. Remains compliant with medications and visits. No agitation or mood swings. No thoughts of harm to self or to others. Does not use with alcohol or sedating medications    Review of Systems    Problem list, past medical history (within the problem list), medications reviewed every visit:    There is no problem list on file for this patient.    Current Outpatient Medications on File Prior to Visit   Medication Sig Dispense Refill    busPIRone (use for BUSPAR) 10 mg tablet Take 10 mg by mouth in the morning and 10 mg at noon and 10 mg in the evening.      clonazePAM (use for KlonoPIN) 0.5 mg tablet Take 1 tablet by mouth daily as needed for anxiety      diclofenac (use for VOLTAREN) 1 % gel Apply 4 g topically every 8 (eight) hours      lamoTRIgine (use for LaMICtal) 150 mg tablet Take 1 tablet by mouth in the morning.      lithium carbonate 300 mg capsule Take 600 mg by mouth      lurasidone (use for LATUDA) 80 mg tablet Take 80 mg by mouth   TAKE ONE TABLET BY MOUTH EVERY EVENING FOR BIPOLAR DEPRESSION TAKE WITH AT LEAST 350 CALORIES OF FOOD FOR THIS MEDICATION TO WORK BEST      metoprolol tartrate (use for LOPRESSOR) 50 mg tablet Take 1 tablet by mouth in the morning and 1 tablet in the evening.      mirtazapine (use for REMERON) 15 mg tablet Take 7.5 mg by mouth nightly       nystatin (use for MYCOSTATIN) ointment Apply 1 Application topically in the morning and 1 Application in the evening. Application site: apply bid to groin area. 30 g 0    sertraline (use for ZOLOFT) 100 mg tablet Take 100 mg by mouth in the morning.      SUMAtriptan (use for IMITREX) 50 mg tablet Take 50 mg by mouth  TAKE ONE TABLET BY MOUTH EVERY DAY AS NEEDED FOR MIGRAINE HEADACHE - MAY REPEAT AFTER 2 HOURS IF NO RELIEF - MAX DOSE 200MG  PER 24 HOURS       No current facility-administered medications on file prior to visit.     There were no vitals taken for this visit.    Physical Exam  Constitutional:       Appearance: Normal appearance.   HENT:      Head: Normocephalic and atraumatic.   Cardiovascular:      Rate and Rhythm: Normal rate and regular rhythm.      Pulses: Normal pulses.      Heart sounds: Normal heart sounds.   Musculoskeletal:      Cervical back:  Normal range of motion and neck supple.   Neurological:      Mental Status: He is alert.   Pulmonary:      Effort: Pulmonary effort is normal.      Breath sounds: Normal breath sounds.      Diagnosis Plan   1. Type 2 diabetes mellitus without complication, without long-term current use of insulin (CMS/HCC)  metFORMIN (use for GLUCOPHAGE) 500 mg tablet    blood-glucose meter (use for GLUCOMETER) kit    lancets    blood glucose test strips (glucose blood)     2. Primary hypertension         1 I provided metformin and glucometer  2 I said to continue current regimen    Assessment/Plan     Recommendations:    No follow-ups on file.       Sharlene Dory, MD  Electronically signed by Sharlene Dory., MD at 02/20/2022  8:09 AM EST

## 2022-03-16 ENCOUNTER — Encounter: Admit: 2022-03-16 | Discharge: 2022-03-16 | Payer: TRICARE (CHAMPUS) | Primary: Family Medicine

## 2022-03-16 DIAGNOSIS — I2510 Atherosclerotic heart disease of native coronary artery without angina pectoris: Secondary | ICD-10-CM

## 2022-03-16 NOTE — Progress Notes (Signed)
[facility]  [address]  [place] [address]  PHONE: [phone]      03/16/22    NAME:  Javier James  DOB: 04-13-1965  MRN: [id number]         SUBJECTIVE:   Javier James is a 57 y.o. male seen for a follow up visit regarding the following:     Chief Complaint   Patient presents with    Coronary Artery Disease    Follow-up     2 months            HPI:  Follow up  Coronary Artery Disease and Follow-up (2 months)   .    Javier James presents today for follow-up.  He underwent recent left heart catheterization after abnormal coronary CT scan.  Was found to have mild, nonobstructive CAD.  LV end-diastolic pressure was mildly elevated.  Weight loss was recommended.  Patient reports been doing well recently.  Denies any chest pain, shortness of breath, orthopnea, PND, palpitations, syncope or lower extremity swelling.    Coronary Artery Disease  Pertinent negatives include no chest pain, leg swelling, palpitations or shortness of breath.           Key CAD CHF Meds            metoprolol tartrate (LOPRESSOR) 50 MG tablet (Taking)    Sig - Route: Take 1 tablet by mouth 2 times daily - Oral          Key Antihyperglycemic Medications            metFORMIN (GLUCOPHAGE) 500 MG tablet (Taking)    Class: Historical Med                Past Medical History, Past Surgical History, Family history, Social History, and Medications were all reviewed with the patient today and updated as necessary.     Prior to Admission medications    Medication Sig Start Date End Date Taking? Authorizing Provider   metFORMIN (GLUCOPHAGE) 500 MG tablet Take 1 tablet by mouth 2 times daily (with meals) 02/20/22 02/20/23 Yes [provider]   lamoTRIgine (LAMICTAL) 150 MG tablet Take 1 tablet by mouth daily 02/19/21  Yes [provider]   lithium 300 MG capsule Take 2 capsules by mouth daily 10/01/21  Yes [provider]   mirtazapine (REMERON) 15 MG tablet Take 0.5  tablets by mouth nightly 10/01/21  Yes [provider]   sertraline (ZOLOFT) 100 MG tablet Take 1.5 tablets by mouth daily 02/19/21  Yes [provider]   lurasidone (LATUDA) 80 MG TABS tablet Take 1 tablet by mouth 04/23/21  Yes [provider]   busPIRone (BUSPAR) 10 MG tablet Take 1 tablet by mouth 3 times daily   Yes [provider]   clonazePAM (KLONOPIN) 0.5 MG tablet Take 1 tablet by mouth 2 times daily as needed.   Yes [provider]   b complex vitamins capsule Take 1 capsule by mouth daily   Yes [provider]   ZINC PO Take by mouth   Yes [provider]   Ascorbic Acid (VITAMIN C PO) Take by mouth   Yes [provider]   VITAMIN D PO Take by mouth   Yes [provider]   metoprolol tartrate (LOPRESSOR) 50 MG tablet Take 1 tablet by mouth 2 times daily 01/06/22  Yes Donzetta Matters, MD     Allergies   Allergen Reactions  Lisinopril      Other Reaction(s): Cough    Penicillins Rash     Other Reaction(s): Pharyngeal spasm, Unknown    Topiramate Rash     Past Medical History:   Diagnosis Date    Hypertension      Past Surgical History:   Procedure Laterality Date    APPENDECTOMY      CARDIAC PROCEDURE N/A 02/12/2022    Left heart cath / coronary angiography performed by Siachos, Almyra Deforest, MD at [facility] CARDIAC CATH LAB    KNEE SURGERY Right      History reviewed. No pertinent family history.  Social History     Tobacco Use    Smoking status: Former     Current packs/day: 0.00     Types: Cigarettes     Start date: 1996     Quit date: 2010     Years since quitting: 14.0     Passive exposure: Past    Smokeless tobacco: Never   Substance Use Topics    Alcohol use: Not on file       ROS:    Review of Systems   Constitutional: Negative for chills, diaphoresis and fever.   HENT:  Negative for hearing loss.    Eyes:  Negative for visual disturbance.   Cardiovascular:  Negative for chest pain, claudication, dyspnea on exertion,  leg swelling, near-syncope, orthopnea, palpitations and paroxysmal nocturnal dyspnea.   Respiratory:  Negative for shortness of breath.    Hematologic/Lymphatic: Does not bruise/bleed easily.   Gastrointestinal:  Negative for abdominal pain.   Genitourinary:  Negative for dysuria.   Neurological:  Negative for focal weakness.   Psychiatric/Behavioral:  Negative for suicidal ideas.           PHYSICAL EXAM:   BP 120/78   Pulse 70   Ht 1.88 m (6\' 2" )   Wt 135.2 kg (298 lb)   BMI 38.26 kg/m      Wt Readings from Last 3 Encounters:   03/16/22 135.2 kg (298 lb)   02/12/22 (!) 136.5 kg (301 lb)   02/06/22 (!) 136.5 kg (301 lb)     BP Readings from Last 3 Encounters:   03/16/22 120/78   02/12/22 104/72   02/06/22 122/60     Pulse Readings from Last 3 Encounters:   03/16/22 70   02/12/22 65   02/06/22 79           Physical Exam  Vitals reviewed.   Constitutional:       General: He is not in acute distress.     Appearance: Normal appearance.   HENT:      Head: Normocephalic and atraumatic.   Eyes:      General: No scleral icterus.  Neck:      Vascular: No carotid bruit.   Cardiovascular:      Rate and Rhythm: Normal rate and regular rhythm.      Heart sounds: No murmur heard.  Pulmonary:      Breath sounds: Normal breath sounds.   Abdominal:      General: Abdomen is flat.      Palpations: Abdomen is soft.   Musculoskeletal:         General: No swelling.      Cervical back: Neck supple.   Skin:     General: Skin is warm and dry.   Neurological:      Mental Status: He is alert and oriented to person, place, and time.   Psychiatric:  Mood and Affect: Mood normal.         Medical problems and test results were reviewed with the patient today.     DATA REVIEW    LIPID PANEL     No results found for: "CHOL"  No results found for: "TRIG"  No results found for: "HDL"  No results found for: "LDLCHOLESTEROL", "LDLCALC"  No results found for: "LABVLDL", "VLDL"  No results found for: "CHOLHDLRATIO"    BMP  Lab Results    Component Value Date/Time    NA 139 02/09/2022 11:07 AM    K 4.3 02/09/2022 11:07 AM    CL 105 02/09/2022 11:07 AM    CO2 28 02/09/2022 11:07 AM    BUN 13 02/09/2022 11:07 AM    CREATININE 1.10 02/09/2022 11:07 AM    GLUCOSE 186 02/09/2022 11:07 AM    CALCIUM 9.7 02/09/2022 11:07 AM          EKG        CXR/IMAGING        DEVICE INTERROGATION        OUTSIDE RECORDS REVIEW    Records from outside providers have been reviewed and summarized as noted in the HPI, past history and data review sections of this note, and reviewed with patient. .       ASSESSMENT and PLAN    Javier James was seen today for coronary artery disease and follow-up.    Diagnoses and all orders for this visit:    Coronary artery disease involving native coronary artery of native heart without angina pectoris          IMPRESSION:    Javier James presents today for follow-up.  He stable from cardiac standpoint today.  Blood pressure is at goal.  We discussed his cath findings, he does have mild, nonobstructive CAD of the LAD.  His most recent LDL cholesterol was 120, he is not interested in statin therapy at this time.  This can make efforts at diet, exercise and weight loss.  Will see him back in 1 year for regular follow-up, or sooner if new issues arise.        Return in about 1 year (around 03/17/2023).          Thank you for allowing me to participate in this patient's care.  Please call or contact me if there are any questions or concerns regarding the above.      Donzetta Matters, MD  03/16/22  3:56 PM

## 2022-06-04 NOTE — Progress Notes (Signed)
Patient ID: Javier James is a 57 y.o. male.    Chief Complaint   Patient presents with   . Diabetes     Colo, Ophthalmology, TDAP, Zoster, and Pneu due   Pt is here to discuss recent lab results. Pt also wants to discuss elevated PSA.        HPI  Patient states he is not taking metformin.  Insomnia: Doing well on current regimen. Remains compliant with followup. No episodes of confusion, amnesia, inappropriate sedation. Does not take with alcohol or other sedating medications.  Hypertension: recheck of HTN. Doing well. Remains compliant with medications. No chest pain, SOB, edema or dizziness. States that normotensive at home.   Patient states he has uncontrolled urination and states he was contacted by urologist but did not go but will go now.  AODM: Recheck of diabetes. Stable on current regimen. Sts Blood sugar typically less than 150. No hyperglycemic symptoms. No reports of hypoglycemic episodes. No reports of adverse drug interactions. Remains compliant with follow-up.    Patient states he gets chest pain with exertion.    Review of Systems    Problem list, past medical history (within the problem list), medications reviewed every visit:    There is no problem list on file for this patient.      Current Outpatient Medications on File Prior to Visit   Medication Sig Dispense Refill   . blood glucose test strips (glucose blood) Check blood sugar 2 times per day. 100 strip 3   . blood-glucose meter (use for GLUCOMETER) kit Check blood sugar 1-2 times per day. 1 each 0   . busPIRone (use for BUSPAR) 10 mg tablet Take 10 mg by mouth in the morning and 10 mg at noon and 10 mg in the evening.     . carvediloL (use for COREG) 3.125 mg tablet Take 3.125 mg by mouth in the morning and 3.125 mg in the evening. Take with meals.     . clonazePAM (use for KlonoPIN) 0.5 mg tablet Take 1 tablet by mouth daily as needed for anxiety     . diclofenac (use for VOLTAREN) 1 % gel Apply 4 g topically every 8 (eight) hours      . lamoTRIgine (use for LaMICtal) 150 mg tablet Take 1 tablet by mouth in the morning. Pt is now taking 75 mg.     . lancets Check blood sugar 2 times per day. 100 each 3   . lithium carbonate 300 mg capsule Take 600 mg by mouth  Pt is taking 200 mg     . lurasidone (use for LATUDA) 80 mg tablet Take 80 mg by mouth  Pt is taking 40mg    TAKE ONE TABLET BY MOUTH EVERY EVENING FOR BIPOLAR DEPRESSION TAKE WITH AT LEAST 350 CALORIES OF FOOD FOR THIS MEDICATION TO WORK BEST     . nystatin (use for MYCOSTATIN) ointment Apply 1 Application topically in the morning and 1 Application in the evening. Application site: apply bid to groin area. 30 g 0   . sertraline (use for ZOLOFT) 100 mg tablet Take 100 mg by mouth in the morning.       No current facility-administered medications on file prior to visit.       Visit Vitals  BP 128/80 (BP Cuff Site Location: Left arm, Patient Position: Sitting, BP Cuff Size : Large)   Pulse 84   Temp 98.2 F (36.8 C) (Forehead)   Wt 133 kg (293 lb 12.8 oz)  SpO2 95%   BMI 39.85 kg/m       Physical Exam  Constitutional:       Appearance: Normal appearance.   HENT:      Head: Normocephalic and atraumatic.   Cardiovascular:      Rate and Rhythm: Normal rate and regular rhythm.      Pulses: Normal pulses.      Heart sounds: Normal heart sounds.   Musculoskeletal:      Cervical back: Normal range of motion and neck supple.   Neurological:      Mental Status: He is alert.   Pulmonary:      Effort: Pulmonary effort is normal.      Breath sounds: Normal breath sounds.        Diagnosis Plan   1. Elevated PSA  Ambulatory referral to Urology      2. Type 2 diabetes mellitus without complication, without long-term current use of insulin (CMS/HCC)        3. Bipolar 1 disorder (CMS/HCC)        4. Insomnia, unspecified type        5. Chest pain, unspecified type          1 I referred to urologist again  2 I said to continue current regimen with diet  3 I said to continue current regimen  4 I said to  continue current regimen  5 I said to see cardiologist  Assessment/Plan      Recommendations:        Return in about 3 months (around 09/04/2022) for 3 month hemoglobin a1c not fasting.          Sharlene Dory, MD

## 2022-07-01 ENCOUNTER — Ambulatory Visit: Admit: 2022-07-01 | Discharge: 2022-07-01 | Payer: TRICARE (CHAMPUS) | Attending: Urology | Primary: Family Medicine

## 2022-07-01 DIAGNOSIS — R972 Elevated prostate specific antigen [PSA]: Secondary | ICD-10-CM

## 2022-07-01 LAB — AMB POC URINALYSIS DIP STICK AUTO W/O MICRO
Bilirubin, Urine, POC: NEGATIVE
Glucose, Urine, POC: NEGATIVE
KETONES, Urine, POC: NEGATIVE
Leukocyte Esterase, Urine, POC: NEGATIVE
Nitrite, Urine, POC: NEGATIVE
Protein, Urine, POC: NEGATIVE
Specific Gravity, Urine, POC: 1.025 (ref 1.001–1.035)
Urobilinogen, POC: 0.2
pH, Urine, POC: 6.5 (ref 4.6–8.0)

## 2022-07-01 MED ORDER — TAMSULOSIN HCL 0.4 MG PO CAPS
0.4 | ORAL_CAPSULE | Freq: Every evening | ORAL | 5 refills | Status: AC
Start: 2022-07-01 — End: ?

## 2022-07-01 MED ORDER — TADALAFIL 20 MG PO TABS
20 | ORAL_TABLET | ORAL | 5 refills | Status: DC | PRN
Start: 2022-07-01 — End: 2022-09-03

## 2022-07-01 NOTE — Telephone Encounter (Signed)
Return for cysto with me next avail. needs rad phone number to sched MRI prostate and CT hematuriia before.   Pt is scheduled for 07/31/22 @ 9am

## 2022-07-01 NOTE — Progress Notes (Unsigned)
[facility]  [address]   [address]  [place], [place] [address]  [phone]    Javier James  DOB: 03-22-1965      CC: Elevated PSA         HPI     Javier James is a 57 y.o. male with PMH of T2DM and who is referred for elevated PSA reading of 6.330 (05/19/22).   Denies urinary incontinence, urgency, no hematuria.           Past Medical History:   Diagnosis Date    Hypertension      Past Surgical History:   Procedure Laterality Date    APPENDECTOMY      CARDIAC PROCEDURE N/A 02/12/2022    Left heart cath / coronary angiography performed by Siachos, Almyra Deforest, MD at [facility] CARDIAC CATH LAB    KNEE SURGERY Right      Current Outpatient Medications   Medication Sig Dispense Refill    metFORMIN (GLUCOPHAGE) 500 MG tablet Take 1 tablet by mouth 2 times daily (with meals)      lamoTRIgine (LAMICTAL) 150 MG tablet Take 1 tablet by mouth daily      lithium 300 MG capsule Take 2 capsules by mouth daily      mirtazapine (REMERON) 15 MG tablet Take 0.5 tablets by mouth nightly      sertraline (ZOLOFT) 100 MG tablet Take 1.5 tablets by mouth daily      lurasidone (LATUDA) 80 MG TABS tablet Take 1 tablet by mouth      busPIRone (BUSPAR) 10 MG tablet Take 1 tablet by mouth 3 times daily      clonazePAM (KLONOPIN) 0.5 MG tablet Take 1 tablet by mouth 2 times daily as needed.      b complex vitamins capsule Take 1 capsule by mouth daily      ZINC PO Take by mouth      Ascorbic Acid (VITAMIN C PO) Take by mouth      VITAMIN D PO Take by mouth      metoprolol tartrate (LOPRESSOR) 50 MG tablet Take 1 tablet by mouth 2 times daily 180 tablet 1     No current facility-administered medications for this visit.     Allergies   Allergen Reactions    Lisinopril      Other Reaction(s): Cough    Penicillins Rash     Other Reaction(s): Pharyngeal spasm, Unknown    Topiramate Rash     Social History     Socioeconomic History    Marital status: Married     Spouse name: Not on file    Number of children:  Not on file    Years of education: Not on file    Highest education level: Not on file   Occupational History    Not on file   Tobacco Use    Smoking status: Former     Current packs/day: 0.00     Types: Cigarettes     Start date: 30     Quit date: 2010     Years since quitting: 14.3     Passive exposure: Past    Smokeless tobacco: Never   Substance and Sexual Activity    Alcohol use: Not on file    Drug use: Not on file    Sexual activity: Not on file   Other Topics Concern    Not on file   Social History Narrative    Not on file  Social Determinants of Health     Financial Resource Strain: Not on file   Food Insecurity: Not on file   Transportation Needs: Not on file   Physical Activity: Not on file   Stress: Not on file   Social Connections: Not on file   Intimate Partner Violence: Not on file   Housing Stability: Not on file     No family history on file.    ROS    Urinalysis  UA - Dipstick  @LASTPROCAMB ([id number])@    UA - Micro  WBC - 0  RBC - 0  Bacteria - 0  Epith - 0        Assessment and Plan  Elevated PSA

## 2022-07-20 ENCOUNTER — Inpatient Hospital Stay: Admit: 2022-07-20 | Payer: TRICARE (CHAMPUS) | Attending: Urology | Primary: Family Medicine

## 2022-07-20 DIAGNOSIS — R972 Elevated prostate specific antigen [PSA]: Secondary | ICD-10-CM

## 2022-07-20 DIAGNOSIS — R3129 Other microscopic hematuria: Secondary | ICD-10-CM

## 2022-07-20 MED ORDER — IOPAMIDOL 76 % IV SOLN
76 | Freq: Once | INTRAVENOUS | Status: AC | PRN
Start: 2022-07-20 — End: 2022-07-20
  Administered 2022-07-20: 11:00:00 100 mL via INTRAVENOUS

## 2022-07-20 MED ORDER — GADOTERIDOL 279.3 MG/ML IV SOLN
279.3 | Freq: Once | INTRAVENOUS | Status: AC | PRN
Start: 2022-07-20 — End: 2022-07-20
  Administered 2022-07-20: 11:00:00 28 mL via INTRAVENOUS

## 2022-07-20 MED FILL — ISOVUE-370 76 % IV SOLN: 76 % | INTRAVENOUS | Qty: 100

## 2022-07-20 MED FILL — PROHANCE 279.3 MG/ML IV SOLN: 279.3 MG/ML | INTRAVENOUS | Qty: 30

## 2022-07-20 NOTE — Other (Signed)
Will review at upcoming appointment.

## 2022-07-20 NOTE — Other (Signed)
Will review at upcoming appointment and discuss next steps.

## 2022-07-31 ENCOUNTER — Telehealth

## 2022-07-31 ENCOUNTER — Encounter: Admit: 2022-07-31 | Discharge: 2022-07-31 | Payer: TRICARE (CHAMPUS) | Attending: Urology | Primary: Family Medicine

## 2022-07-31 DIAGNOSIS — N401 Enlarged prostate with lower urinary tract symptoms: Secondary | ICD-10-CM

## 2022-07-31 LAB — AMB POC URINALYSIS DIP STICK AUTO W/O MICRO
Bilirubin, Urine, POC: NEGATIVE
Blood (UA POC): NEGATIVE
Glucose, Urine, POC: NEGATIVE
KETONES, Urine, POC: NEGATIVE
Leukocyte Esterase, Urine, POC: NEGATIVE
Nitrite, Urine, POC: NEGATIVE
Protein, Urine, POC: NEGATIVE
Specific Gravity, Urine, POC: 1.02 (ref 1.001–1.035)
Urobilinogen, POC: 0.2
pH, Urine, POC: 7 (ref 4.6–8.0)

## 2022-07-31 MED ORDER — FLEET ENEMA 7-19 GM/118ML RE ENEM
7-19 | Freq: Once | RECTAL | 0 refills | Status: AC
Start: 2022-07-31 — End: 2022-07-31

## 2022-07-31 MED ORDER — CIPROFLOXACIN HCL 500 MG PO TABS
500 | ORAL_TABLET | Freq: Two times a day (BID) | ORAL | 0 refills | Status: AC
Start: 2022-07-31 — End: 2022-08-01

## 2022-07-31 NOTE — Telephone Encounter (Signed)
-----   Message from Caryl Comes, MD sent at 07/31/2022  9:11 AM EDT -----  Regarding: Surgery Scheduling  Hospital: [facility]    Surgeon: Darrol Poke    Assist: NONE    Diagnosis: Elevated PSA    Procedure: MRI Fusion Prostate Biopsy    Posting time: 30 minutes    Special Instruments Needed:  NONE    Anesthesia: MAC    Labs:  U/A    Tests: EKG per anesthesia    Blood: NONE    Bowel Prep: NONE    Special Instructions: Cipro/enema sent to pharmacy    Orders/HandP:     Follow up appointment: 2 weeks TRUS talk appointment

## 2022-07-31 NOTE — Telephone Encounter (Signed)
Called the patient but he requested a call back.  Tentative hold placed on schedule 09/08/22.

## 2022-07-31 NOTE — Telephone Encounter (Signed)
Patient wanted to let surgery scheduler know if he can't be reached by phone please contact by Mychart.

## 2022-07-31 NOTE — Progress Notes (Signed)
[facility]  [address]   [address]  [place], [place] [address]  [phone]    Javier James  DOB: 1966-01-19         HPI   57 y.o., male who presents for cystoscopy for evaluation of microscopic hematuria.     He has a PMH of T2DM and who is referred for elevated PSA reading of 6.330 (05/19/22), microscopic hematuria, ED and BPH/LUTS.      Today, he reports a PMH of bladder lesion in 2016 s/p TURBT at the [place] that showed it was benign.  Does have microscopic hematuria today on U/A.  Has not had follow up cysto since TURBT.       Does have urgency, frequency, UUI.  NO hematuria.  No urinary incontinence. Flomax was started 06/2022 for LUTS and has helped.  Cialis started 06/2022 for ED and also has helped. No side effects.       PSA trend below.  No personal or FH of GU cancer.      CT hematuria protocol 07/2022 showed benign R renal cyst.  MRI prostate 07/2022 showed PIRADS 5 lesion concerning for prostate cancer on the R side of the prostate.      PSA 6.33 (05/2022)     PSA 5.010 (12/2021)       Past Medical History:   Diagnosis Date    Elevated PSA     Hypertension      Past Surgical History:   Procedure Laterality Date    APPENDECTOMY      BLADDER SURGERY  2016    Lesion removed    CARDIAC PROCEDURE N/A 02/12/2022    Left heart cath / coronary angiography performed by Lurlean Nanny, MD at [facility] CARDIAC CATH LAB    KNEE SURGERY Right     VASECTOMY  1994     Current Outpatient Medications   Medication Sig Dispense Refill    ciprofloxacin (CIPRO) 500 MG tablet Take 1 tablet by mouth 2 times daily for 1 day Begin AM of prostate biopsy. 2 tablet 0    tadalafil (CIALIS) 20 MG tablet Take 1 tablet by mouth as needed for Erectile Dysfunction 20 tablet 5    tamsulosin (FLOMAX) 0.4 MG capsule Take 1 capsule by mouth nightly 30 capsule 5    lamoTRIgine (LAMICTAL) 150 MG tablet Take 1 tablet by mouth daily      lithium 300 MG capsule Take 2 capsules by mouth daily      mirtazapine (REMERON) 15  MG tablet Take 0.5 tablets by mouth nightly      sertraline (ZOLOFT) 100 MG tablet Take 1.5 tablets by mouth daily      lurasidone (LATUDA) 80 MG TABS tablet Take 1 tablet by mouth      busPIRone (BUSPAR) 10 MG tablet Take 1 tablet by mouth 3 times daily      clonazePAM (KLONOPIN) 0.5 MG tablet Take 1 tablet by mouth 2 times daily as needed.      b complex vitamins capsule Take 1 capsule by mouth daily      ZINC PO Take by mouth      Ascorbic Acid (VITAMIN C PO) Take by mouth      VITAMIN D PO Take by mouth       No current facility-administered medications for this visit.     Allergies   Allergen Reactions    Lisinopril      Other Reaction(s): Cough    Penicillins Rash  Other Reaction(s): Pharyngeal spasm, Unknown    Topiramate Rash     Social History     Socioeconomic History    Marital status: Married     Spouse name: Not on file    Number of children: Not on file    Years of education: Not on file    Highest education level: Not on file   Occupational History    Not on file   Tobacco Use    Smoking status: Former     Current packs/day: 0.00     Average packs/day: 0.5 packs/day for 28.0 years (14.0 ttl pk-yrs)     Types: Cigarettes     Start date: 03/09/1994     Quit date: 2010     Years since quitting: 14.4     Passive exposure: Past    Smokeless tobacco: Never   Substance and Sexual Activity    Alcohol use: Not Currently    Drug use: Never    Sexual activity: Yes     Partners: Female   Other Topics Concern    Not on file   Social History Narrative    Not on file     Social Determinants of Health     Financial Resource Strain: Not on file   Food Insecurity: Not on file   Transportation Needs: Not on file   Physical Activity: Not on file   Stress: Not on file   Social Connections: Not on file   Intimate Partner Violence: Not on file   Housing Stability: Not on file     Family History   Problem Relation Age of Onset    Heart Disease Father        There were no vitals taken for this visit.    UA -  Dipstick  Results for orders placed or performed in visit on 07/31/22   AMB POC URINALYSIS DIP STICK AUTO W/O MICRO   Result Value Ref Range    Color (UA POC)      Clarity (UA POC)      Glucose, Urine, POC Negative     Bilirubin, Urine, POC Negative     KETONES, Urine, POC Negative     Specific Gravity, Urine, POC 1.020 1.001 - 1.035    Blood (UA POC) Negative     pH, Urine, POC 7 4.6 - 8.0    Protein, Urine, POC Negative     Urobilinogen, POC 0.2 mg/dL     Nitrite, Urine, POC Negative     Leukocyte Esterase, Urine, POC Negative        UA - Micro  WBC - 0  RBC - 0  Bacteria - 0  Epith - 0    There were no vitals taken for this visit.     GENERAL: No acute distress, Awake, Alert, Oriented X 3, Gait normal  CARDIAC: regular rate and rhythm  CHEST AND LUNG: Easy work of breathing, clear to auscultation bilaterally, no cyanosis  ABDOMEN: soft, non tender, non-distended, positive bowel sounds, no organomegaly, no palpable masses, no guarding, no rebound tenderness  SKIN: No rash, no erythema, no lacerations or abrasions, no ecchymosis  NEUROLOGIC: cranial nerves 2-12 grossly intact         Cystoscopy Procedure:     Procedure Room # 1  Scope ID: dispo  Assistant: rm      All risks, benefits and alternatives were again reviewed with patient and he is willing to proceed at this time.  His genital area was prepped  and draped and a sterile field applied. 2% lidocaine jelly was injected in the the urethra and allowed to dwell for several minutes.  A flexible cystoscope was then inserted into the urethral meatus and advanced under direct vision.  The anterior and posterior urethra appeared normal in appearance.  The prostatic urethra was obstructed with lateral lobe prostate hypertrophy amenable to urolift or TURP.  The bladder was systematically surveyed.  +1 bladder trabeculations were seen.  No mucosal abnormalities were seen.  The ureteral orifices were seen in their normal orthotopic position.  The cystoscope was then  removed under direct vision.  The patient tolerated the procedure well.    CT Hematuria: 07/20/22    IMPRESSION:  Small right renal parapelvic cyst.     No solid renal masses renal calculi or obstructive uropathy is identified.    MRI Prostate: 07/20/22    IMPRESSION:  PI-RADS 5: Focus of abnormal signal and enhancement extending from the mid to  apex of the right peripheral zone. There is also abnormal signal throughout the  right and left side of the transition zone which raises the possibility of  additional spread of disease. Posterior bulging of the peripheral zone in the  vicinity of the neurovascular bundle is also concerning for early extracapsular  spread of disease.    Assessment and Plan    ICD-10-CM    1. Benign prostatic hyperplasia with urinary frequency  N40.1 AMB POC URINALYSIS DIP STICK AUTO W/O MICRO    R35.0 PR CYSTOURETHROSCOPY      2. Elevated PSA  R97.20 ciprofloxacin (CIPRO) 500 MG tablet     sodium phosphate (FLEET) 7-19 GM/118ML      3. Microscopic hematuria  R31.29       4. Renal cyst  N28.1       5. Erectile dysfunction due to arterial insufficiency  N52.01         Microscopic Hematuria:   Most likely of prostate origin based on work up today.      Elevated PSA:   Reviewed MRI prostate results today.  Concerning for prostate cancer on the R side.  I recommended MRI fusion prostate biopsy .  Risks/benefits reviewed.  He wants to proceed. Surgery scheduler will call     BPH/LUTS:   Continue flomax.  Working well.     Renal cysts:   Benign based on CT scan.  No further evaluation needed    ED:   Continue cialis 20 mg PRN.  Working well.     Patient had a complete established visit today for elevated PSA, renal cysts, ED, BPH/LUTS that is separately identifiable from procedure performed today for microscopic hematuria evaluation.  Complete documentation of this separate visit is present.     I have spent 30 minutes today reviewing previous notes, test results and face to face with the patient as  well as documenting.      Neena Beecham A. Darrol Poke, M.D.    [facility]  [facility]  [address]  [place], [place] [address]  Phone: [phone]  Fax: [phone]

## 2022-08-04 ENCOUNTER — Encounter

## 2022-08-04 NOTE — Telephone Encounter (Signed)
Pt returned call.  Case request sent to scheduling for [facility] 09/08/22.

## 2022-08-04 NOTE — Telephone Encounter (Signed)
Procedures: Procedure(s):   MRI FUSION PROSTATE BIOPSY   Date: 09/08/2022   Time: 1000   Location: [facility] MAIN OR 03     Scheduled.  Please complete orders.  Post op appt scheduled.

## 2022-09-03 NOTE — Other (Signed)
Patient verified name and DOB.  Order for consent  found in EHR and matches case posting; patient verifies procedure.   Type 1B surgery, Phone assessment complete.  Orders  received.  Labs per surgeon: UC, UA, BMP, >> Pt currently on vacation will need to collect DOS  Labs per anesthesia protocol: none    Patient answered medical/surgical history questions at their best of ability. All prior to admission medications documented in EPIC.    Patient instructed to continue taking all prescription medications up to the day of surgery but to take only the following medications the day of surgery according to anesthesia guidelines with a small sip of water: Buspirone (Buspar), Clonazepam (Klonopin), Lamotrigine (Lamictal), Sertraline (Zoloft), Lithium, Latuda, Tamsulosin (Flomax),  Also, patient is requested to take 2 Tylenol in the morning and then again before bed on the day before surgery. Regular or extra strength may be used.       Patient informed that all vitamins and supplements should be held 7 days prior to surgery and NSAIDS 5 days prior to surgery. Prescription meds to hold:none    Patient instructed on the following:    > Arrive at [facility], time of arrival to be called the day before by 1700  > NPO after midnight, unless otherwise indicated, including gum, mints, and ice chips  > Responsible adult must drive patient to the hospital, stay during surgery, and patient will need supervision 24 hours after anesthesia  > Use non moisturizing soap in shower the night before surgery and on the morning of surgery  > All piercings must be removed prior to arrival.    > Leave all valuables (money and jewelry) at home but bring insurance card and ID on DOS.   > You may be required to pay a deductible or co-pay on the day of your procedure. You can pre-pay by calling [phone] if your surgery is at the [facility] or [phone] if your surgery is at the [facility].  > Do not wear make-up, nail polish,  lotions, cologne, perfumes, powders, or oil on skin. Artificial nails are not permitted.

## 2022-09-08 ENCOUNTER — Inpatient Hospital Stay: Payer: TRICARE (CHAMPUS) | Attending: Urology

## 2022-09-08 LAB — URINALYSIS
Bilirubin, Urine: NEGATIVE
Blood, Urine: NEGATIVE
Glucose, Ur: NEGATIVE mg/dL
Ketones, Urine: NEGATIVE mg/dL
Leukocyte Esterase, Urine: NEGATIVE
Nitrite, Urine: NEGATIVE
Protein, UA: NEGATIVE mg/dL
Specific Gravity, UA: 1.021 (ref 1.001–1.023)
Urobilinogen, Urine: 1 EU/dL (ref 0.2–1.0)
pH, Urine: 7.5 (ref 5.0–9.0)

## 2022-09-08 LAB — BASIC METABOLIC PANEL
Anion Gap: 10 mmol/L (ref 9–18)
BUN: 13 MG/DL (ref 6–23)
CO2: 22 mmol/L (ref 20–28)
Calcium: 9.1 MG/DL (ref 8.8–10.2)
Chloride: 105 mmol/L (ref 98–107)
Creatinine: 1.07 MG/DL (ref 0.80–1.30)
Est, Glom Filt Rate: 82 mL/min/{1.73_m2} (ref >60)
Glucose: 151 mg/dL — ABNORMAL HIGH (ref 70–99)
Sodium: 137 mmol/L (ref 136–145)

## 2022-09-08 MED ORDER — OXYCODONE HCL 5 MG PO TABS
5 | Freq: Once | ORAL | Status: DC | PRN
Start: 2022-09-08 — End: 2022-09-08

## 2022-09-08 MED ORDER — NORMAL SALINE FLUSH 0.9 % IV SOLN
0.9 | Freq: Two times a day (BID) | INTRAVENOUS | Status: DC
Start: 2022-09-08 — End: 2022-09-08

## 2022-09-08 MED ORDER — SODIUM CHLORIDE (PF) 0.9 % IJ SOLN
0.9 % | INTRAMUSCULAR | Status: DC | PRN
Start: 2022-09-08 — End: 2022-09-08

## 2022-09-08 MED ORDER — MIDAZOLAM HCL (PF) 2 MG/2ML IJ SOLN
2 | Freq: Once | INTRAMUSCULAR | Status: DC | PRN
Start: 2022-09-08 — End: 2022-09-08

## 2022-09-08 MED ORDER — SODIUM CHLORIDE 0.9 % IV SOLN
0.9 | INTRAVENOUS | Status: DC | PRN
Start: 2022-09-08 — End: 2022-09-08

## 2022-09-08 MED ORDER — NORMAL SALINE FLUSH 0.9 % IV SOLN
0.9 | INTRAVENOUS | Status: DC | PRN
Start: 2022-09-08 — End: 2022-09-08

## 2022-09-08 MED ORDER — HYDROMORPHONE HCL PF 2 MG/ML IJ SOLN
2 | INTRAMUSCULAR | Status: DC | PRN
Start: 2022-09-08 — End: 2022-09-08

## 2022-09-08 MED ORDER — FENTANYL CITRATE (PF) 100 MCG/2ML IJ SOLN
100 | Freq: Once | INTRAMUSCULAR | Status: DC | PRN
Start: 2022-09-08 — End: 2022-09-08

## 2022-09-08 MED ORDER — ONDANSETRON HCL 4 MG/2ML IJ SOLN
4 | Freq: Once | INTRAMUSCULAR | Status: DC | PRN
Start: 2022-09-08 — End: 2022-09-08

## 2022-09-08 MED ORDER — LIDOCAINE HCL (PF) 2 % IJ SOLN
2 % | INTRAMUSCULAR | Status: DC | PRN
  Administered 2022-09-08: 14:00:00 40 via INTRAVENOUS

## 2022-09-08 MED ORDER — ACETAMINOPHEN 500 MG PO TABS
500 | Freq: Once | ORAL | Status: AC
Start: 2022-09-08 — End: 2022-09-08
  Administered 2022-09-08: 14:00:00 1000 mg via ORAL

## 2022-09-08 MED ORDER — DIPHENHYDRAMINE HCL 50 MG/ML IJ SOLN
50 | Freq: Once | INTRAMUSCULAR | Status: DC | PRN
Start: 2022-09-08 — End: 2022-09-08

## 2022-09-08 MED ORDER — LACTATED RINGERS IV SOLN
INTRAVENOUS | Status: DC
Start: 2022-09-08 — End: 2022-09-08
  Administered 2022-09-08: 14:00:00 via INTRAVENOUS

## 2022-09-08 MED ORDER — PROPOFOL 200 MG/20ML IV EMUL
200 MG/20ML | INTRAVENOUS | Status: DC | PRN
  Administered 2022-09-08 (×2): 50 via INTRAVENOUS
  Administered 2022-09-08: 14:00:00 100 via INTRAVENOUS
  Administered 2022-09-08: 14:00:00 50 via INTRAVENOUS

## 2022-09-08 MED ORDER — GENTAMICIN IN SALINE 0.8-0.9 MG/ML-% IV SOLN
INTRAVENOUS | Status: AC
Start: 2022-09-08 — End: 2022-09-08
  Administered 2022-09-08: 14:00:00 80 mg via INTRAVENOUS

## 2022-09-08 MED FILL — GENTAMICIN IN SALINE 0.8-0.9 MG/ML-% IV SOLN: INTRAVENOUS | Qty: 100

## 2022-09-08 MED FILL — ACETAMINOPHEN EXTRA STRENGTH 500 MG PO TABS: 500 MG | ORAL | Qty: 2

## 2022-09-08 NOTE — Other (Signed)
Mr. Javier James, your prostate biopsy did show prostate cancer.  It is considered "high risk" prostate cancer and will need to be treated.  We do need to get a PET scan to fully stage your prostate cancer and make sure that it has not spread.  I ordered this today and you will get a call to schedule it.  It would be helpful if you can try to get this done prior to your follow up with me.  We will review all imaging and discuss treatment options at that time.     Best Regards,  Dr. Darrol Poke

## 2022-09-08 NOTE — Op Note (Signed)
Operative Note        Patient: Javier James, [id number]    Date of Surgery: 09/08/22    Preoperative Diagnosis: Elevated PSA    Postoperative Diagnosis:  same    Surgeon(s) and Role:     * Dwaine Deter, MD - Primary     Anesthesia:  MAC     Procedure: Procedure(s):  URONAV MRI fused TRUS prostate biopsy     Indications:     Discussed the risk of surgery including infection, hematoma, bleeding, recurrence or persistence of symptoms or stone, and the risks of general anesthetic.  The patient understands the risks, any and all questions were answered to the patient's satisfaction and they freely signed the consent for operation.     URONAV MRI FUSED TRANSRECTAL ULTRASOUND GUIDED BIOPSY OF THE PROSTATE    All risks, benefits and alternatives were again reviewed and he is willing to proceed at this time.  The patient was placed in the left lateral decubitus position.  I then inserted the transrectal ultrasound probe into the rectum.  The prostate was visualized.  The prostate appeared homogenous in appearance.   Ultrasonographic sweep of the prostate was performed to obtain images to link to MRI via URONAV.  There were 1 regions of interest based upon MRI imaging.  4 biopsies of regions of interest were then obtained using the ultrasound images for guidance that had been linked to the previous MRI using Uronav.  I then performed 12 needle core biopsies using a standard sextant biopsy format with traditional ultrasound images for guidance.  The ultrasound probe was removed.  The patient tolerated the procedure well.      PROSTATE VOLUME:  35 cc    Estimated Blood Loss:  minimal    Specimens: prostate biopsies             Drains: none                 Implants: * No implants in log *    Rolen Conger A. Darrol Poke, M.D.    [facility]  [address] [facility]  [place], [place] [address]  Phone: [phone]

## 2022-09-08 NOTE — Discharge Instructions (Addendum)
If you have had surgery in the past 7-10 days by one of our providers and are having fever, bleeding, or having issues urinating properly, please call [phone].    Prostate Biopsy and Ultrasound:   A prostate biopsy is a type of test. Your doctor takes small tissue samples from your prostate gland. Then another doctor looks at the tissue under a microscope to see if there are cancer cells.  This test is done by a doctor who specializes in men's genital and urinary problems (urologist). It can be done in your doctor's office, a day surgery clinic, or a hospital operating room. To get the tissue samples from the prostate, the doctor inserts a thin needle through the rectum, the urethra, or the area between the anus and scrotum (perineum). The most common method is through the rectum. Your doctor may use ultrasound to help guide the needle.  What else should you know about this test?  A prostate biopsy has a slight risk of causing problems such as infection or bleeding.  If the biopsy went through your rectum, you may have a small amount of bleeding from your rectum for 2 to 3 days after the biopsy.  You may have a little pain in your pelvic area. You may also have a little blood in your urine for 1 to 5 days.  You may have some blood in your semen for a week or longer.  Do not do heavy work or exercise for 4 hours after the test.  Your doctor will tell you how long it may take to get your results back.  Follow-up care is a key part of your treatment and safety. Be sure to make and go to all appointments, and call your doctor if you are having problems. It's also a good idea to keep a list of the medicines you take. Ask your doctor when you can expect to have your test results.    After general anesthesia or intravenous sedation, for 24 hours or while taking prescription Narcotics:  Limit your activities  A responsible adult needs to be with you for the next 24 hours  Do not drive and operate hazardous  machinery  Do not make important personal or business decisions  Do not drink alcoholic beverages  If you have not urinated within 8 hours after discharge, and you are experiencing discomfort from urinary retention, please go to the nearest ED.  If you have sleep apnea and have a CPAP machine, please use it for all naps and sleeping.  Please use caution when taking narcotics and any of your home medications that may cause drowsiness.  *  Please give a list of your current medications to your Primary Care Provider.  *  Please update this list whenever your medications are discontinued, doses are      changed, or new medications (including over-the-counter products) are added.  *  Please carry medication information at all times in case of emergency situations.    These are general instructions for a healthy lifestyle:  No smoking/ No tobacco products/ Avoid exposure to second hand smoke  Surgeon General's Warning:  Quitting smoking now greatly reduces serious risk to your health.  Obesity, smoking, and sedentary lifestyle greatly increases your risk for illness  A healthy diet, regular physical exercise & weight monitoring are important for maintaining a healthy lifestyle    You may be retaining fluid if you have a history of heart failure or if you experience any of the following   symptoms:  Weight gain of 3 pounds or more overnight or 5 pounds in a week, increased swelling in our hands or feet or shortness of breath while lying flat in bed.  Please call your doctor as soon as you notice any of these symptoms; do not wait until your next office visit.

## 2022-09-08 NOTE — H&P (Signed)
[facility] H&P Note                                           09/08/22     Patient: Javier James  MRN: [id number]    Admission Date:  09/08/2022, 0  Admission Diagnosis: Elevated PSA [R97.20]  Reason for Consult: Elevated PSA    ASSESSMENT: 57 y.o. male with a PMH of elevated PSA who presents for MRI fusion prostate biopsy    PLAN:  -To OR for MRI fusion prostate biopsy  -Consented  -Antibiotic on call to OR  -NPO for procedure.       __________________________________________________________________________________    HPI:     Javier James is a 57 y.o. male with a PMH of elevated PSA who presents for MRI fusion prostate biopsy    No changes in medical history since last seen by me.     Past Medical History:  Past Medical History:   Diagnosis Date    Elevated PSA     Hypertension     Sleep apnea     uses cpap hs       Past Surgical History:  Past Surgical History:   Procedure Laterality Date    APPENDECTOMY      BLADDER SURGERY  2016    Lesion removed    CARDIAC PROCEDURE N/A 02/12/2022    Left heart cath / coronary angiography performed by Lurlean Nanny, MD at [facility] CARDIAC CATH LAB    KNEE SURGERY Right     VASECTOMY  1994       Medications:  No current facility-administered medications on file prior to encounter.     Current Outpatient Medications on File Prior to Encounter   Medication Sig Dispense Refill    tamsulosin (FLOMAX) 0.4 MG capsule Take 1 capsule by mouth nightly 30 capsule 5    lamoTRIgine (LAMICTAL) 150 MG tablet Take 1 tablet by mouth daily      lithium 300 MG capsule Take 2 capsules by mouth daily      sertraline (ZOLOFT) 100 MG tablet Take 1.5 tablets by mouth daily      lurasidone (LATUDA) 80 MG TABS tablet Take 1 tablet by mouth      busPIRone (BUSPAR) 10 MG tablet Take 1 tablet by mouth 3 times daily      clonazePAM (KLONOPIN) 0.5 MG tablet Take 1 tablet by mouth 2 times daily as needed.         Allergies:  Allergies    Allergen Reactions    Lisinopril      Other Reaction(s): Cough    Penicillins Rash     Other Reaction(s): Pharyngeal spasm, Unknown    Topiramate Rash       Social History:  Social History     Socioeconomic History    Marital status: Married     Spouse name: Not on file    Number of children: Not on file    Years of education: Not on file    Highest education level: Not on file   Occupational History    Not on file   Tobacco Use    Smoking status: Former     Current packs/day: 0.00     Average packs/day: 0.5 packs/day for 28.0 years (14.0 ttl pk-yrs)     Types: Cigarettes     Start date: 03/09/1994  Quit date: 2010     Years since quitting: 14.5     Passive exposure: Past    Smokeless tobacco: Never   Vaping Use    Vaping Use: Never used   Substance and Sexual Activity    Alcohol use: Not Currently    Drug use: Never    Sexual activity: Yes     Partners: Female   Other Topics Concern    Not on file   Social History Narrative    Not on file     Social Determinants of Health     Financial Resource Strain: Not on file   Food Insecurity: Not on file   Transportation Needs: Not on file   Physical Activity: Not on file   Stress: Not on file   Social Connections: Not on file   Intimate Partner Violence: Not on file   Housing Stability: Not on file       Family History:  Family History   Problem Relation Age of Onset    Heart Disease Father        ROS:  Review of Systems - General ROS: negative for - chills, fatigue, or fever    Vitals:  Vitals:    09/08/22 0904   BP: 133/82   Pulse: 70   Resp: 12   Temp: 97.8 F (36.6 C)   SpO2: 97%       Intake/Output:  No intake or output data in the 24 hours ending 09/08/22 0953     Physical Exam:   BP 133/82   Pulse 70   Temp 97.8 F (36.6 C) (Oral)   Resp 12   Ht 1.854 m (6\' 1" )   Wt 130.2 kg (287 lb)   SpO2 97%   BMI 37.87 kg/m      GENERAL: No acute distress, Awake, Alert, Oriented X 3  CARDIAC: regular rate and rhythm  CHEST AND LUNG: Easy work of breathing,   ABDOMEN:  soft, non tender, non-distended,     Lab/Radiology/Other Diagnostic Tests:  No results for input(s): "HGB", "HCT", "WBC", "NA", "K", "CL", "CO2", "BUN", "CR", "GLU", "MG", "ALBUMIN", "AST", "ALT", "ALKPHOS", "LIPASE", "PREALB", "INR" in the last 72 hours.    Invalid input(s): "PLTCT", "CA", "PO4", "TOTPROT", "TOTBILI", "AMY", "PT", "PTT"      Marleah Beever A. Darrol Poke, M.D.    [facility]  [address] [facility]  [place], [place] [address]  Phone: [phone]

## 2022-09-08 NOTE — Anesthesia Post-Procedure Evaluation (Signed)
Department of Anesthesiology  Postprocedure Note    Patient: Javier James  MRN: [id number]  Birthdate: 02/02/1966  Date of evaluation: 09/08/2022    Procedure Summary       Date: 09/08/22 Room / Location: [facility] MAIN OR 08 / [facility] MAIN OR    Anesthesia Start: 1003 Anesthesia Stop: 1026    Procedure: MRI FUSION PROSTATE BIOPSY (Anus) Diagnosis:       Elevated PSA      (Elevated PSA [R97.20])    Providers: Caryl Comes, MD Responsible Provider: Barbaraann Faster, MD    Anesthesia Type: TIVA ASA Status: 3            Anesthesia Type: TIVA    Aldrete Phase I: Aldrete Score: 10    Aldrete Phase II:      Anesthesia Post Evaluation    Patient location during evaluation: PACU  Patient participation: complete - patient participated  Level of consciousness: awake and alert  Airway patency: patent  Nausea & Vomiting: no nausea and no vomiting  Cardiovascular status: hemodynamically stable  Respiratory status: acceptable, nonlabored ventilation and spontaneous ventilation  Hydration status: euvolemic  Comments: BP 125/84   Pulse 74   Temp 98.2 F (36.8 C) (Skin)   Resp 14   Ht 1.854 m (6\' 1" )   Wt 130.2 kg (287 lb)   SpO2 93%   BMI 37.87 kg/m     Multimodal analgesia pain management approach  Pain management: adequate and satisfactory to patient    No notable events documented.

## 2022-09-08 NOTE — Anesthesia Pre-Procedure Evaluation (Addendum)
Department of Anesthesiology  Preprocedure Note       Name:  Javier James   Age:  57 y.o.  DOB:  Apr 21, 1965                                          MRN:  [id number]         Date:  09/08/2022      Surgeon: Moishe Spice):  Caryl Comes, MD    Procedure: Procedure(s):  MRI FUSION PROSTATE BIOPSY    Medications prior to admission:   Prior to Admission medications    Medication Sig Start Date End Date Taking? Authorizing Provider   tamsulosin (FLOMAX) 0.4 MG capsule Take 1 capsule by mouth nightly 07/01/22   Caryl Comes, MD   lamoTRIgine (LAMICTAL) 150 MG tablet Take 1 tablet by mouth daily 02/19/21   [provider]   lithium 300 MG capsule Take 2 capsules by mouth daily 10/01/21   [provider]   sertraline (ZOLOFT) 100 MG tablet Take 1.5 tablets by mouth daily 02/19/21   [provider]   lurasidone (LATUDA) 80 MG TABS tablet Take 1 tablet by mouth 04/23/21   [provider]   busPIRone (BUSPAR) 10 MG tablet Take 1 tablet by mouth 3 times daily    [provider]   clonazePAM (KLONOPIN) 0.5 MG tablet Take 1 tablet by mouth 2 times daily as needed.    [provider]       Current medications:    Current Facility-Administered Medications   Medication Dose Route Frequency Provider Last Rate Last Admin   . fentaNYL (SUBLIMAZE) injection 100 mcg  100 mcg IntraVENous Once PRN Antanisha Mohs, [place], MD       . lactated ringers IV soln infusion   IntraVENous Continuous Rayne Loiseau, [place], MD 125 mL/hr at 09/08/22 0930 New Bag at 09/08/22 0930   . sodium chloride flush 0.9 % injection 5-40 mL  5-40 mL IntraVENous 2 times per day Lekisha Mcghee, [place], MD       . sodium chloride flush 0.9 % injection 5-40 mL  5-40 mL IntraVENous PRN Sevin Langenbach, Molli Hazard B, MD       . 0.9 % sodium chloride infusion   IntraVENous PRN Wynton Hufstetler, [place], MD       . midazolam PF (VERSED) injection 2 mg  2 mg IntraVENous Once PRN Ilene Witcher, [place], MD       . sodium chloride flush 0.9  % injection 5-40 mL  5-40 mL IntraVENous 2 times per day Caryl Comes, MD       . sodium chloride flush 0.9 % injection 5-40 mL  5-40 mL IntraVENous PRN Caryl Comes, MD       . 0.9 % sodium chloride infusion   IntraVENous PRN Caryl Comes, MD       . gentamicin (GARAMYCIN) IVPB 80 mg  80 mg IntraVENous On Call to OR Caryl Comes, MD   Held at 09/08/22 [phone]       Allergies:    Allergies   Allergen Reactions   . Lisinopril      Other Reaction(s): Cough   . Penicillins Rash     Other Reaction(s): Pharyngeal spasm, Unknown   . Topiramate Rash       Problem List:    Patient Active Problem List   Diagnosis Code   . Accelerating angina (HCC) I20.0   .  Chest discomfort R07.89   . Coronary artery calcification seen on CT scan I25.10   . 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 Elevated PSA R97.20       Past Medical History:        Diagnosis Date   . Elevated PSA    . Hypertension    . Sleep apnea     uses cpap hs       Past Surgical History:        Procedure Laterality Date   . APPENDECTOMY     . BLADDER SURGERY  2016    Lesion removed   . CARDIAC PROCEDURE N/A 02/12/2022    Left heart cath / coronary angiography performed by Siachos, Almyra Deforest, MD at [facility] CARDIAC CATH LAB   . KNEE SURGERY Right    . VASECTOMY  1994       Social History:    Social History     Tobacco Use   . Smoking status: Former     Current packs/day: 0.00     Average packs/day: 0.5 packs/day for 28.0 years (14.0 ttl pk-yrs)     Types: Cigarettes     Start date: 03/09/1994     Quit date: 2010     Years since quitting: 14.5     Passive exposure: Past   . Smokeless tobacco: Never   Substance Use Topics   . Alcohol use: Not Currently                                Counseling given: Not Answered      Vital Signs (Current):   Vitals:    09/03/22 0931 09/08/22 0904   BP:  133/82   Pulse:  70   Resp:  12   Temp:  97.8 F (36.6 C)   TempSrc:  Oral   SpO2:  97%   Weight: 127  kg (280 lb) 130.2 kg (287 lb)   Height: 1.88 m (6\' 2" ) 1.854 m (6\' 1" )                                              BP Readings from Last 3 Encounters:   09/08/22 133/82   03/16/22 120/78   02/12/22 104/72       NPO Status: Time of last liquid consumption: 0000                        Time of last solid consumption: 0000                        Date of last liquid consumption: 09/07/22                        Date of last solid food consumption: 09/07/22    BMI:   Wt Readings from Last 3 Encounters:   09/08/22 130.2 kg (287 lb)   07/20/22 135.2 kg (298 lb)   03/16/22 135.2 kg (298 lb)     Body mass index is 37.87 kg/m.    CBC:   Lab Results   Component Value Date/Time    WBC 8.8 02/09/2022 11:07 AM    RBC 5.48 02/09/2022 11:07  AM    HGB 15.7 02/09/2022 11:07 AM    HCT 48.8 02/09/2022 11:07 AM    MCV 89.1 02/09/2022 11:07 AM    RDW 12.4 02/09/2022 11:07 AM    PLT 284 02/09/2022 11:07 AM       CMP:   Lab Results   Component Value Date/Time    NA 139 02/09/2022 11:07 AM    K 4.3 02/09/2022 11:07 AM    CL 105 02/09/2022 11:07 AM    CO2 28 02/09/2022 11:07 AM    BUN 13 02/09/2022 11:07 AM    CREATININE 1.10 02/09/2022 11:07 AM    LABGLOM >60 02/09/2022 11:07 AM    GLUCOSE 186 02/09/2022 11:07 AM    CALCIUM 9.7 02/09/2022 11:07 AM    BILITOT 1.0 02/09/2022 11:07 AM    ALKPHOS 81 02/09/2022 11:07 AM    AST 44 02/09/2022 11:07 AM    ALT 77 02/09/2022 11:07 AM       POC Tests: No results for input(s): "POCGLU", "POCNA", "POCK", "POCCL", "POCBUN", "POCHEMO", "POCHCT" in the last 72 hours.    Coags: No results found for: "PROTIME", "INR", "APTT"    HCG (If Applicable): No results found for: "PREGTESTUR", "PREGSERUM", "HCG", "HCGQUANT"     ABGs: No results found for: "PHART", "PO2ART", "PCO2ART", "HCO3ART", "BEART", "O2SATART"     Type & Screen (If Applicable):  No results found for: "LABABO"    Drug/Infectious Status (If Applicable):  No results found for: "HIV", "HEPCAB"    COVID-19 Screening (If Applicable): No results found  for: "COVID19"        Anesthesia Evaluation  Patient summary reviewed and Nursing notes reviewed  Airway: Mallampati: II  TM distance: >3 FB   Neck ROM: full  Mouth opening: > = 3 FB   Dental: normal exam         Pulmonary:Negative Pulmonary ROS and normal exam  breath sounds clear to auscultation  (+)     sleep apnea:                                  Cardiovascular:  Exercise tolerance: good (>4 METS)  (+) hypertension:        Rhythm: regular  Rate: normal                    Neuro/Psych:   Negative Neuro/Psych ROS              GI/Hepatic/Renal:   (+) morbid obesity          Endo/Other: Negative Endo/Other ROS                    Abdominal:             Vascular: negative vascular ROS.         Other Findings:       Anesthesia Plan      TIVA     ASA 3       Induction: intravenous.      Anesthetic plan and risks discussed with patient.                    Barbaraann Faster, MD   09/08/2022

## 2022-09-10 LAB — CULTURE, URINE: Culture: NO GROWTH

## 2022-09-11 ENCOUNTER — Encounter

## 2022-09-14 NOTE — Progress Notes (Signed)
Patient ID: Javier James is a 57 y.o. male.    Chief Complaint   Patient presents with   . Follow-up     Pt is here to discuss recent lab results and diagnosis of prostate cancer.    . Joint Pain     Pt states that both of his elbows are hurting.    . Fatigue     Pt just doesn't feel good. He is tired all the time.        HPI    Patient states he is anxious.   Patient states he takes a long time to fall asleep.  Patient states he has good sugar control.  Patient states he is seeing urologist for prostate cancer.  Review of Systems    Problem list, past medical history (within the problem list), medications reviewed every visit:    There is no problem list on file for this patient.      Current Outpatient Medications on File Prior to Visit   Medication Sig Dispense Refill   . blood glucose test strips (glucose blood) Check blood sugar 2 times per day. 100 strip 3   . blood-glucose meter (use for GLUCOMETER) kit Check blood sugar 1-2 times per day. 1 each 0   . busPIRone (use for BUSPAR) 10 mg tablet Take 10 mg by mouth in the morning and 10 mg at noon and 10 mg in the evening.     . carvediloL (use for COREG) 3.125 mg tablet Take 3.125 mg by mouth in the morning and 3.125 mg in the evening. Take with meals.     . clonazePAM (use for KlonoPIN) 0.5 mg tablet Take 1 tablet by mouth daily as needed for anxiety     . diclofenac (use for VOLTAREN) 1 % gel Apply 4 g topically every 8 (eight) hours     . lamoTRIgine (use for LaMICtal) 150 mg tablet Take 1 tablet by mouth in the morning. Pt is now taking 75 mg.     . lancets Check blood sugar 2 times per day. 100 each 3   . lithium carbonate 300 mg capsule Take 600 mg by mouth  Pt is taking 200 mg     . lurasidone (use for LATUDA) 80 mg tablet Take 80 mg by mouth  Pt is taking 40mg    TAKE ONE TABLET BY MOUTH EVERY EVENING FOR BIPOLAR DEPRESSION TAKE WITH AT LEAST 350 CALORIES OF FOOD FOR THIS MEDICATION TO WORK BEST     . nystatin (use for MYCOSTATIN) ointment Apply  1 Application topically in the morning and 1 Application in the evening. Application site: apply bid to groin area. 30 g 0   . sertraline (use for ZOLOFT) 100 mg tablet Take 100 mg by mouth in the morning.     . tadalafiL (CIALIS) 20 mg tablet TAKE ONE TABLET BY MOUTH AS NEEDED FOR ERECTILE DYSFUNCTION     . tamsulosin (use for FLOMAX) 0.4 mg capsule Take 1 capsule by mouth nightly     . traZODone (use for DESYREL) 100 mg tablet Take 100 mg by mouth nightly   TAKE ONE TABLET BY MOUTH AT BEDTIME FOR INSOMNIA ASSOCIATED WITH DEPRESSION       No current facility-administered medications on file prior to visit.       Visit Vitals  BP 110/78 (BP Cuff Site Location: Left arm, Patient Position: Sitting, BP Cuff Size : Large)   Pulse 80   Temp 98.7 F (37.1 C) (Forehead)  Wt 131 kg (287 lb 12.8 oz)   SpO2 95%   BMI 39.03 kg/m       Physical Exam  Constitutional:       Appearance: Normal appearance.   HENT:      Head: Normocephalic and atraumatic.   Cardiovascular:      Rate and Rhythm: Normal rate and regular rhythm.      Pulses: Normal pulses.      Heart sounds: Normal heart sounds.   Musculoskeletal:      Cervical back: Normal range of motion and neck supple.   Neurological:      Mental Status: He is alert.   Pulmonary:      Effort: Pulmonary effort is normal.      Breath sounds: Normal breath sounds.         Assessment/Plan  No diagnosis found.   Diagnosis Plan   1. Prostate cancer (CMS/HCC)        2. Type 2 diabetes mellitus without complication, without long-term current use of insulin (CMS/HCC)        3. Anxiety          1 I said to continue to see urologist  2 I said to continue current regimen  3 I provided Vistaril    Recommendations:        No follow-ups on file.          Sharlene Dory, MD

## 2022-09-24 ENCOUNTER — Inpatient Hospital Stay: Admit: 2022-09-24 | Payer: TRICARE (CHAMPUS) | Primary: Family Medicine

## 2022-09-24 DIAGNOSIS — C61 Malignant neoplasm of prostate: Secondary | ICD-10-CM

## 2022-09-24 MED ORDER — NORMAL SALINE FLUSH 0.9 % IV SOLN
0.9 | Freq: Once | INTRAVENOUS | Status: AC | PRN
Start: 2022-09-24 — End: 2022-09-24
  Administered 2022-09-24: 16:00:00 10 mL via INTRAVENOUS

## 2022-09-24 MED ORDER — PIFLUFOLASTAT F 18 9 MCI IV SOSY
9 | Freq: Once | INTRAVENOUS | Status: AC | PRN
Start: 2022-09-24 — End: 2022-09-24
  Administered 2022-09-24: 16:00:00 9.84 via INTRAVENOUS

## 2022-09-24 NOTE — Other (Signed)
Will review at upcoming appointment.

## 2022-10-01 ENCOUNTER — Encounter: Payer: PRIVATE HEALTH INSURANCE | Attending: Urology | Primary: Family Medicine

## 2022-10-05 ENCOUNTER — Ambulatory Visit: Admit: 2022-10-05 | Discharge: 2022-10-05 | Payer: TRICARE (CHAMPUS) | Attending: Urology | Primary: Family Medicine

## 2022-10-05 DIAGNOSIS — C61 Malignant neoplasm of prostate: Secondary | ICD-10-CM

## 2022-10-05 NOTE — Progress Notes (Signed)
[facility]  [address]    [address]  [place], [place] [address]  [phone]    Harun Chaffins  DOB: 05-27-1965    Chief Complaint   Patient presents with    Advice Only    Elevated PSA     HPI     Javier James is a 57 y.o. Caucasian male with recently diagnosed high risk prostate cancer who presents for counseling discussion today.     He has a PMH of T2DM and who is referred for elevated PSA reading of 6.330 (05/19/22), microscopic hematuria, ED and BPH/LUTS.      Today, he reports a PMH of bladder lesion in 2016 s/p TURBT at the [place] that showed it was benign.  Does have microscopic hematuria today on U/A.  Has not had follow up cysto since TURBT.       Does have urgency, frequency, UUI.  NO hematuria.  No urinary incontinence. Flomax was started 06/2022 for LUTS and has helped.  Cialis started 06/2022 for ED and also has helped. No side effects.       PSA trend below.  No personal or FH of GU cancer.       CT hematuria protocol 07/2022 showed benign R renal cyst.  MRI prostate 07/2022 showed PIRADS 5 lesion concerning for prostate cancer on the R side of the prostate.  MRI fusion prostate biopsy 09/2022 showed gleason 8 CAP.  PSMA PET scan negative for metastatic disease 09/2022.       PSA 6.33 (05/2022)     PSA 5.010 (12/2021)     Past Medical History:   Diagnosis Date    Elevated PSA     Hypertension     Sleep apnea     uses cpap hs     Past Surgical History:   Procedure Laterality Date    APPENDECTOMY      BLADDER SURGERY  2016    Lesion removed    CARDIAC PROCEDURE N/A 02/12/2022    Left heart cath / coronary angiography performed by Lurlean Nanny, MD at [facility] CARDIAC CATH LAB    KNEE SURGERY Right     PROSTATE BIOPSY N/A 09/08/2022    MRI FUSION PROSTATE BIOPSY performed by Caryl Comes, MD at [facility] MAIN OR    VASECTOMY  1994     Current Outpatient Medications   Medication Sig Dispense Refill    tamsulosin (FLOMAX) 0.4 MG capsule Take 1 capsule by mouth nightly 30  capsule 5    lamoTRIgine (LAMICTAL) 150 MG tablet Take 1 tablet by mouth daily      lithium 300 MG capsule Take 2 capsules by mouth daily      sertraline (ZOLOFT) 100 MG tablet Take 1.5 tablets by mouth daily      lurasidone (LATUDA) 80 MG TABS tablet Take 1 tablet by mouth      busPIRone (BUSPAR) 10 MG tablet Take 1 tablet by mouth 3 times daily      clonazePAM (KLONOPIN) 0.5 MG tablet Take 1 tablet by mouth 2 times daily as needed.       No current facility-administered medications for this visit.     Allergies   Allergen Reactions    Lisinopril      Other Reaction(s): Cough    Penicillins Rash     Other Reaction(s): Pharyngeal spasm, Unknown    Topiramate Rash     Social History     Socioeconomic History    Marital status: Married  Spouse name: Not on file    Number of children: Not on file    Years of education: Not on file    Highest education level: Not on file   Occupational History    Not on file   Tobacco Use    Smoking status: Former     Current packs/day: 0.00     Average packs/day: 0.5 packs/day for 28.0 years (14.0 ttl pk-yrs)     Types: Cigarettes     Start date: 03/09/1994     Quit date: 2010     Years since quitting: 14.5     Passive exposure: Past    Smokeless tobacco: Never   Vaping Use    Vaping Use: Never used   Substance and Sexual Activity    Alcohol use: Not Currently    Drug use: Never    Sexual activity: Yes     Partners: Female   Other Topics Concern    Not on file   Social History Narrative    Not on file     Social Determinants of Health     Financial Resource Strain: Not on file   Food Insecurity: Not on file   Transportation Needs: Not on file   Physical Activity: Not on file   Stress: Not on file   Social Connections: Not on file   Intimate Partner Violence: Not on file   Housing Stability: Not on file     Family History   Problem Relation Age of Onset    Heart Disease Father        Review of Systems  Constitutional:   Negative for fever, chills, appetite change, malaise/fatigue,  headaches and weight loss.  Skin:  Negative for skin lesions, rash and itching.  Eyes:  Negative for visual disturbance, eye pain and eye discharge.  ENT:  Negative for difficulty articulating words, pain swallowing, high frequency hearing loss and dry mouth.  Respiratory:  Negative for cough, blood in sputum, shortness of breath and wheezing.  Cardiovascular:  Negative for chest pain, hypertension, irregular heartbeat, leg pain, leg swelling, regular rate and rhythm and varicose veins.  GI:  Negative for nausea, vomiting, abdominal pain, blood in stool, constipation, diarrhea, indigestion and heartburn.  Genitourinary: Positive for discharge. Negative for urinary burning, hematuria, flank pain, recurrent UTIs, history of urolithiasis, nocturia, slower stream, straining, urgency, leakage w/ urge, frequent urination, incomplete emptying, erectile dysfunction, testicular pain, sexually transmitted disease and urethral stricture.  Musculoskeletal:  Negative for back pain, bone pain, arthralgias, tenderness, muscle weakness and neck pain.  Neurological:  Negative for dizziness, focal weakness, numbness, seizures and tremors.  Psychological:  Negative for depression and psychiatric problem.  Endocrine:  Negative for cold intolerance, thirst, excessive urination, fatigue and heat intolerance.  Hem/Lymphatic:  Negative for easy bleeding, easy bruising and frequent infections.    There were no vitals taken for this visit.     GENERAL: No acute distress, Awake, Alert, Oriented X 3, Gait normal  CARDIAC: regular rate and rhythm  CHEST AND LUNG: Easy work of breathing, clear to auscultation bilaterally, no cyanosis  ABDOMEN: soft, non tender, non-distended, positive bowel sounds, no organomegaly, no palpable masses, no guarding, no rebound tenderness  SKIN: No rash, no erythema, no lacerations or abrasions, no ecchymosis  NEUROLOGIC: cranial nerves 2-12 grossly intact     PET scan:     IMPRESSION:  Focal uptake in the right  lateral prostate gland, consistent with known disease.  PSMA Score 5..    MRI  Prostate:   IMPRESSION:  PI-RADS 5: Focus of abnormal signal and enhancement extending from the mid to  apex of the right peripheral zone. There is also abnormal signal throughout the  right and left side of the transition zone which raises the possibility of  additional spread of disease. Posterior bulging of the peripheral zone in the  vicinity of the neurovascular bundle is also concerning for early extracapsular  spread of disease.    Assessment and Plan    ICD-10-CM    1. Prostate cancer (HCC)  C61         CAP:      I discussed with the patient and his wife that I would classify him as having high risk prostate cancer based on his pathology results.  For further staging, PSMA PET scan showed no metastatic disease.     I reviewed the NCCN and AUA guidelines for treatment of high risk prostate cancer in detail.  Options include Surgery vs. Radiation therapy vs. Watchful waiting (not recommended given his age and life expectancy > 10 years).      We spent the majority of our discussion today on surgery and I reviewed that it typically is done robotically and requires a 1-2 day hospital stay with foley catheter for 10-14 days post-op.  There is a 6 week post-op recovery period.  Robotic vs. Open approaches reviewed and he favors the robotic approach.  We discussed the risks of stress urinary incontinence, erectile dysfunction, risk of bleeding, infection, injury to surrounding structures, risk of general anesthesia, anastomotic leak, bladder neck contracture, cancer recurrence, need for nephrostomy tubes in the even of urine leak, need for repeat or revision surgery.  We discussed that I think that he is a good surgical candidate if he chooses to pursue that route.  We also reviewed that in my experience younger patients who are good surgical candidates tend to do better with surgery while older patients with more co-morbidities tend to  do better with radiation.  We discussed that comparison studies tend to show a slight benefits of surgery in younger patients when compared to radiation but that overall they are pretty comparable treatment options.       We also discussed radiation and ADT briefly with it's risks/benefits.  We briefly reviewed different forms of radiation (brachytherapy vs. cyberknife vs. external beam radiation).  We discussed the risks of urethral stricture, overactive bladder, ED, hemorrhagic cystitis, hemorrhagic proctitis, secondary malignancy, injury to surrounding structures among others, that can be encountered with radiation.  We also discussed the fatigue, hot flashes, ED, osteoporosis that can occur with radiation therapy.       At the end of our discussion, he would like to proceed with RALP/BPLND.  Will refer to Dr. Rozann Lesches to plan for RALP surgery.       I have spent 44 minutes today reviewing previous notes, test results and face to face with the patient as well as documenting.      Layton Tappan A. Darrol Poke, M.D.    [facility]  [facility]  [address]  [place], [place] [address]  Phone: [phone]  Fax: [phone]

## 2022-10-06 ENCOUNTER — Telehealth

## 2022-10-06 ENCOUNTER — Ambulatory Visit: Admit: 2022-10-06 | Discharge: 2022-10-06 | Payer: TRICARE (CHAMPUS) | Attending: Urology | Primary: Family Medicine

## 2022-10-06 DIAGNOSIS — C61 Malignant neoplasm of prostate: Secondary | ICD-10-CM

## 2022-10-06 NOTE — Telephone Encounter (Signed)
-----   Message from Pryor Montes, DO sent at 10/06/2022 12:13 PM EDT -----  Regarding: surg  RALP  Pt wants early Sept

## 2022-10-06 NOTE — Progress Notes (Signed)
[facility]  [address]  [place], [place] [address]  [phone]    Javier James  DOB: 09/27/1965     HPI   57 y.o., male presents in consultation for prostate cancer.  Fusion biopsies completed by Dr. Darrol Poke on 09/08/22.  Prebx PS was 6.87 and vol was 35g.  Path showed Gleason 7 & 8 in all R sided cores.  MRI on 07/20/22 showed a pirads 5 RM/RA lesion with questionable ECE and bilateral TZ lesions.  PSMA on 09/24/22 shows R sided prostate update without mets.  Prior appy.  Reports min LUTS on Flomax and reports success with Cialis for ED.  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      Past Medical History:   Diagnosis Date    Elevated PSA     Hypertension     Sleep apnea     uses cpap hs     Past Surgical History:   Procedure Laterality Date    APPENDECTOMY      BLADDER SURGERY  2016    Lesion removed    CARDIAC PROCEDURE N/A 02/12/2022    Left heart cath / coronary angiography performed by Lurlean Nanny, MD at [facility] CARDIAC CATH LAB    KNEE SURGERY Right     PROSTATE BIOPSY N/A 09/08/2022    MRI FUSION PROSTATE BIOPSY performed by Caryl Comes, MD at [facility] MAIN OR    VASECTOMY  1994     Current Outpatient Medications   Medication Sig Dispense Refill    tamsulosin (FLOMAX) 0.4 MG capsule Take 1 capsule by mouth nightly 30 capsule 5    lamoTRIgine (LAMICTAL) 150 MG tablet Take 1 tablet by mouth daily      lithium 300 MG capsule Take 2 capsules by mouth daily      sertraline (ZOLOFT) 100 MG tablet Take 1.5 tablets by mouth daily      lurasidone (LATUDA) 80 MG TABS tablet Take 1 tablet by mouth      busPIRone (BUSPAR) 10 MG tablet Take 1 tablet by mouth 3 times daily      clonazePAM (KLONOPIN) 0.5 MG tablet Take 1 tablet by mouth 2 times daily as needed.       No current facility-administered medications for this visit.     Allergies   Allergen Reactions    Lisinopril      Other Reaction(s): Cough    Penicillins Rash     Other Reaction(s):  Pharyngeal spasm, Unknown    Topiramate Rash     Social History     Socioeconomic History    Marital status: Married     Spouse name: Not on file    Number of children: Not on file    Years of education: Not on file    Highest education level: Not on file   Occupational History    Not on file   Tobacco Use    Smoking status: Former     Current packs/day: 0.00     Average packs/day: 0.5 packs/day for 28.0 years (14.0 ttl pk-yrs)     Types: Cigarettes     Start date: 03/09/1994     Quit date: 2010     Years since quitting: 14.5     Passive exposure: Past    Smokeless tobacco: Never   Vaping Use    Vaping Use: Never used   Substance and Sexual Activity    Alcohol use: Not Currently  Drug use: Never    Sexual activity: Yes     Partners: Female   Other Topics Concern    Not on file   Social History Narrative    Not on file     Social Determinants of Health     Financial Resource Strain: Not on file   Food Insecurity: Not on file   Transportation Needs: Not on file   Physical Activity: Not on file   Stress: Not on file   Social Connections: Not on file   Intimate Partner Violence: Not on file   Housing Stability: Not on file     Family History   Problem Relation Age of Onset    Heart Disease Father        Review of Systems  All systems reviewed and are negative at this time.    Physical Exam  There were no vitals taken for this visit.  General appearance - alert, well appearing, and in no distress  Mental status - alert, oriented to person, place, and time  Eyes - extraocular eye movements intact, sclera anicteric  Nose - normal and patent, no erythema, discharge or polyps  Mouth - mucous membranes moist  Abdomen - soft, nontender, nondistended, no masses or organomegaly.  Small reducible umb hernia.  Lymphatic-  No palpable lymphadenopathy  Neurological -  normal speech, no focal findings or movement disorder noted  Musculoskeletal - no deformity or swelling  Extremities - no pedal edema, no clubbing or cyanosis  Skin -  normal coloration and turgor      Assessment/Plan    ICD-10-CM    1. Prostate cancer (HCC)  C61         All tx options reviewed.  Pathology results from the biopsy and all available treatment options were reviewed in detail with the patient today.  Treatments options discussed but not limited to included surgery (open, perineal and robotic assisted laparoscopic approaches), external beam radiation, brachytherapy, proton beam therapy, cryosurgery, HIFU, androgen deprivation therapy and watchful waiting including active surveillance.  I discussed all risks, possible side effects and benefits associated with the above treatment options.  I specifically discussed the 30-40% risk of permanent ED in men without preoperative ED, 100% risk of ED in men with preoperative ED, 10-15% risk of permanent incontinence requiring pads, and 2-3% risk of severe incontinence in patients undergoing open or robotic radical prostatectomy.  He wishes to proceed with RALP and bilateral PLND.  All risks, benefits and alternatives to the above mentioned procedure were discussed and the patient is willing to proceed at this time.      Angelina Neece P Teia Freitas, DO

## 2022-10-08 ENCOUNTER — Encounter

## 2022-10-08 NOTE — Telephone Encounter (Signed)
Procedures: Procedure(s):   PROSTATECTOMY LAPAROSCOPIC ROBOTIC,POSSIBLE BILATERAL LYMPH NODE DISSECTION TO THE PROSTATE   Date: 11/16/2022   Time: 1058   Location: [facility] MAIN OR 11     COMPLETE ORDERS

## 2022-10-08 NOTE — Addendum Note (Signed)
Addended by: Nada Libman on: 10/08/2022 01:56 PM     Modules accepted: Orders

## 2022-10-09 ENCOUNTER — Inpatient Hospital Stay: Admit: 2022-10-09 | Payer: TRICARE (CHAMPUS) | Primary: Family Medicine

## 2022-10-19 ENCOUNTER — Inpatient Hospital Stay: Admit: 2022-10-19 | Payer: TRICARE (CHAMPUS) | Primary: Family Medicine

## 2022-10-19 DIAGNOSIS — R279 Unspecified lack of coordination: Secondary | ICD-10-CM

## 2022-10-19 NOTE — Progress Notes (Signed)
Javier James  DOB: 03-22-1965  Primary: [facility] (Other)  Secondary:  [facility]  [address]  [place] [address]  Phone: [phone]  Fax: [phone] Plan Frequency: TBD POST OP  Plan of Care/Certification Expiration Date: 01/19/23        Plan of Care/Certification Expiration Date:  Plan of Care/Certification Expiration Date: 01/19/23    Frequency/Duration: Plan Frequency: TBD POST OP      Time In/Out:   Time In: 0901  Time Out: 0944      PT Visit Info:    Progress Note Due Date: 01/19/23      Visit Count:  1    OUTPATIENT PHYSICAL THERAPY:   Treatment Note 10/19/2022       Episode  (PFPT)               Treatment Diagnosis:    Lack of coordination  Medical/Referring Diagnosis:    Prostate cancer [facility])    Referring Physician:  Pryor Montes, DO  MD Orders:  PT Eval and Treat   Return MD Appt:     Date of Onset:  No data recorded   Allergies:   Lisinopril, Penicillins, and Topiramate  Restrictions/Precautions:   None      Interventions Planned (Treatment may consist of any combination of the following):     See Assessment Note    Subjective Comments:     Initial Pain Level::     0/10  Post Session Pain Level:       0/10  Medications Last Reviewed:  10/19/2022  Updated Objective Findings:  See Evaluation Note from today  Treatment   THERAPEUTIC ACTIVITY: ( 25 minutes): Functional activity education regarding anatomy, pathology and role of pelvic floor muscle (PFM) function in relation to presenting symptoms and role of pelvic floor therapy in conservative treatment. and Functional activity education regarding pre and post operative bowel and bladder health including bladder irritants, changes in urinary symptoms, bowel management and toilet positioning and role and purpose of penile rehab for healing s/p RALP.    TA Educational Topic Notes Date Completed   Pathology/Anatomy/PFM Function reviewed 10/19/22   Bladder health education     Urinary  urge suppression     The knack     Voiding strategies     Nocturia tips     Bowel/Bladder log     Bowel health education     Constipation care     Diarrhea/Fecal leakage     Colonic massage     Toilet positioning     Defecation dynamics     Sources of fiber     Return to intercourse/vaginal trainers/wand     Perineal massage     Sexual positioning     Lubricants/vaginal moisturizers     Vulvovaginal health/vaginal irritants     Body mechanics     Posture/ergonomics     Diaphragmatic breathing     Pain science education     Resources and technology     Other patient education Pre-prostatectomy packet review inclusive of: kegel HEP, bladder health education, toilet positioning, post surgical precautions Q+A  10/19/22     THERAPEUTIC EXERCISE: ( minutes):    Exercises per grid below to improve mobility, strength, and coordination.  Required moderate visual, verbal, and tactile cues to promote proper body alignment, promote proper body posture, promote proper body mechanics, and promote proper body breathing techniques.  Progressed resistance, range, repetitions, and complexity of movement  as indicated.   Date:   Date:   Date:     Activity/Exercise Parameters Parameters Parameters                       MANUAL THERAPY: ( minutes):   Soft tissue mobilization was utilized and necessary because of the patient's painful spasm and restricted motion of soft tissue.   Date Type Location Comments   10/19/2022 Internal assessment/treatment Via rectal canal completed                                       (Used abbreviations: MET - muscle energy technique; SCS- Strain counter strain; CTM-Connective tissue mobilizations; CR- Contract/Relax; SP- Sustained pressure; PIT- Positional inhibition techniques; STM Soft -tissue mobilization; MM- Myofascial mobilization; TrP-Trigger point release; IASTM- Instrument assisted soft tissue mobilizations, TDN-Trigger point dry needling)    Pt gives verbal consent to internal rectal  assessment/treatment without chaperon present.'t    Treatment/Session Summary:    Treatment Assessment:   Pt reports good understanding of plan of care, as well as prescribed home exercise program. All questions were answered to pts satisfaction. Pt was invited to call with any further questions or concerns.   Communication/Consultation:  None today  Equipment provided today:  None  Recommendations/Intent for next treatment session: Next visit will focus on   1. Scar massage  2. Urge suppression if needed  3. Body mechanics  4. Strength and mobility  .    >Total Treatment Billable Duration:  43 minutes   Time In: 0901  Time Out: 0944    Beulah Gandy, PT         Charge Capture  Events  [employer] Portal  Appt Desk  Attendance Report     Future Appointments   Date Time Provider Department Center   11/04/2022 10:00 AM Sterrett, Lyndle Herrlich, DO [facility]   11/06/2022 11:15 AM [facility] ASSESSMENT RM 07 [facility]   11/23/2022  3:45 PM Sterrett, Lyndle Herrlich, DO [facility]

## 2022-10-19 NOTE — Other (Signed)
Javier James  DOB: 02-06-1966  Primary: [facility] (Other)  Secondary:  [facility]  [address]  [place] [address]  Phone: [phone]  Fax: [phone] Plan Frequency: TBD POST OP    Plan of Care/Certification Expiration Date: 01/19/23        Plan of Care/Certification Expiration Date:  Plan of Care/Certification Expiration Date: 01/19/23    Frequency/Duration: Plan Frequency: TBD POST OP      Time In/Out:   Time In: 0901  Time Out: 0944    PT Visit Info:    Plan Frequency: TBD POST OP  Progress Note Due Date: 01/19/23      Visit Count:  1                OUTPATIENT PHYSICAL THERAPY:             Initial Assessment 10/19/2022               Episode (PFPT)         Treatment Diagnosis:     Lack of coordination  Contributing Diagnosis:  Malignant neoplasm of prostate (C61) and Urgency of urination (R39.15)  Medical/Referring Diagnosis:    Prostate cancer [facility])      Referring Physician:  Pryor Montes, DO  MD Orders:  PT Eval and Treat   Return MD Appt:    Date of Onset:      Allergies:  Lisinopril, Penicillins, and Topiramate  Restrictions/Precautions:    None      Medications Last Reviewed:  10/19/2022     SUBJECTIVE   History of Injury/Illness (Reason for Referral):  Javier James is 57 yo male referred to pelvic floor physical therapy (PFPT) 2/2 prostate cancer by Pryor Montes, DO. Findings were found in routine screening. He will be undergoing RALP on sept 9th.    He is having some trouble emptying his bladder and is up at night to urinate. He does have some urinary urgency but is ale to make it to the bathroom.  Denies pelvic pain.     Hx of bladder lesion in 2016. He did not have any issues following this.     Exercise routine: free weights occasionally, 2 miles of walking in the evening.  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    Sleep apnea: he wears a cpap      Urinary: Frequency 5 x/day, 1-2  x/night.  Positive for incomplete emptying.   Negative for stress urinary incontinence, urge urinary incontinence, urinary urgency, urinary frequency, urinary hesitancy/intermittency, dysuria, hematuria, nocturia, and nocturnal enuresis.   Pt does not use pads for urinary protection; 0 pads per day (PPD).          Fluid intake:   64 water/day; bladder irritants include: coffee x 36oz, 2 diet sodas per day, hot tea before bed (1-2 hours). Pt does not limit fluid intake due to bladder control.    Bowel: Frequency 1-2x.   Positive for  none .   Negative for pain with bowel movement, pushing/straining with bowel movement, fecal incontinence, constipation, and incomplete emptying.   Pt does not use pads for bowel protection; 0 pads per day (PPD). Bristol stool type: 4. Use of stool softeners or laxatives? No      Patient Stated Goal(s):  "bladder control"    Initial Pain Level:     0/10   Post Session Pain Level:     0/10  Past Medical History/Comorbidities:   Javier James  has a past medical history of Elevated PSA, Hypertension, and Sleep apnea.  Javier James  has a past surgical history that includes knee surgery (Right); Appendectomy; Cardiac procedure (N/A, 02/12/2022); Bladder surgery (2016); Vasectomy (1994); and Prostate biopsy (N/A, 09/08/2022).    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        Learning:   Does the patient/guardian have any barriers to learning?: No barriers  Will there be a co-learner?: No  What is the preferred language of the patient/guardian?: English  Is an interpreter required?: No  How does the patient/guardian prefer to learn new concepts?: Listening; Demonstration; Reading; Pictures/Videos      Fall Risk Scale:   Morse Total Score: 0         OBJECTIVE   External Observation:  Voluntary contraction:  []  absent     [x]  present  Voluntary relaxation:  []  absent     [x]   present  Postural assessment: Forward Head Posture and Rounded Shoulders  Gait: WNL    Patient positioning for assessment: Sidelying    Sensation testing: Intact    External palpation:  Coccyx  Positioning: Midline  Tenderness: Absent     Internal Rectal Assessment:  Palpation:  Muscle (superficial to deep) Tenderness?   External anal sphincter []  Right     []  Left     []  DNT   Internal anal sphincter []  Right     []  Left     []  DNT   Puborectalis []  Right     []  Left     []  DNT   Pubococcygeus []  Right     []  Left     []  DNT   Iliococcygeus []  Right     []  Left     []  DNT   Coccygeus []  Right     []  Left     []  DNT   Piriformis []  Right     []  Left     []  DNT   Obturator internus []  Right     []  Left     []  DNT       Sacrospinous ligament []  Right     []  Left     []  DNT     Contraction Ability:  Voluntary contraction: []  absent     []  weak     []  moderate      [x]  strong  Voluntary relaxation: []  absent     []  partial   [x]  complete   []  delayed    []  incomplete    MMT: 4/5   EAS endurance (goal 60 seconds): 10 seconds  PFM endurance: 20 seconds   Repetitions of endurance contractions: 4  Quality of contractions: good  Overflow: [x]  absent     []  min     []  mod     []  severe / Compensatory mm groups include:      ASSESSMENT   Initial Assessment: Javier James presents decreased coordination, strength and endurance of pelvic floor muscle pre operatively. Today he was educated on bladder health, kegel exercises, pelvic floor anatomy and role of musculature and precautions with catheter post operatively. He required minimal cuing for proper activation and isolation of pelvic floor muscles. He was able to contract muscle for 20 seconds with no breath holding and accessory muscle use of none. He was given  kegel exercises to do at home prior to surgery and once catheter is removed. He was educated on pain after surgery and precautions with kegel exercises. He was able to demonstrate improved coordination by  the end of the session today. He will continue to benefit from skilled physical therapy to address above mentioned deficits and restore normal PFM function post operatively to minimize urinary incontinence.    Therapy Problem List: (Impacting functional limitations):    Decreased Strength, Decreased ROM, Decreased Functional Mobility, Decreased Independence with Home Exercise Program, Decreased Posture, Decreased Body Mechanics, and Decreased Activity Tolerance/Endurance*   Therapy Prognosis:   Good     Initial Assessment Complexity:   Low Complexity     PLAN   Effective Dates: 10/19/2022 TO Plan of Care/Certification Expiration Date: 01/19/23     Frequency/Duration: Plan Frequency: TBD POST OP     Interventions Planned (Treatment may consist of any combination of the following):    Home Exercise Program (HEP), Therapeutic Activites, and Therapeutic Exercise/Strengthening     Outcome Measure:   NIH - Chronic Prostatitis Symptom Index (NIH - CPSI for Males):  Score:  Initial: 9  Pain: 0  Urinary: 6  QOL: 3 Most Recent: X (Date: -- )       Goals: (Goals have been discussed and agreed upon with patient.)  Short-Term Functional Goals: Time Frame: 4 weeks  Patient will demonstrate I with basic PFM HEP to improve awareness, coordination, and timing of PFM.  Patient will verbalize an understanding of pelvic anatomy and causes of post op incontinence.  Patient will demonstrate understanding of and ability to teach back appropriate water intake, bladder irritants, toileting frequency, and positioning for improved self-management of symptoms.        Discharge Goals:   DISCHARGE GOALS TO BE DETERMINED POST OPERATIVELY            Medical Necessity:   > Skilled intervention continues to be required due to above mentioned deficits.  Reason For Services/Other Comments:  > Patient continues to require skilled intervention due to above mentioned deficits.    Regarding Javier James's therapy, I certify that the treatment  plan above will be carried out by a therapist or under their direction.  Thank you for this referral,  Beulah Gandy, PT     Referring Physician Signature: Pryor Montes, DO _______________________________ Date _____________        Charge Capture  Appt Desk  Attendance Report  Events        Future Appointments   Date Time Provider Department Center   11/04/2022 10:00 AM Sterrett, Lyndle Herrlich, DO [facility]   11/06/2022 11:15 AM [facility] ASSESSMENT RM 07 [facility]   11/23/2022  3:45 PM Sterrett, Remi Deter P, DO [facility]

## 2022-11-04 ENCOUNTER — Ambulatory Visit: Admit: 2022-11-04 | Discharge: 2022-11-04 | Payer: TRICARE (CHAMPUS) | Attending: Urology | Primary: Family Medicine

## 2022-11-04 DIAGNOSIS — C61 Malignant neoplasm of prostate: Secondary | ICD-10-CM

## 2022-11-04 NOTE — Progress Notes (Signed)
 [facility]  [address]  [place], [place] [address]  [phone]    Javier James  DOB: 03-22-1965     HPI   57 y.o., male returns in follow up for prostate cancer. Fusion biopsies completed by Dr. Cherylann on 09/08/22. Prebx PS was 6.87 and vol was 35g. Path showed Gleason 7 & 8 in all R sided cores. MRI on 07/20/22 showed a pirads 5 RM/RA lesion with questionable ECE and bilateral TZ lesions. PSMA on 09/24/22 shows R sided prostate update without mets. Prior appy. Reports min LUTS on Flomax  and reports success with Cialis  for ED. 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       Past Medical History:   Diagnosis Date    Elevated PSA     Hypertension     Sleep apnea     uses cpap hs     Past Surgical History:   Procedure Laterality Date    APPENDECTOMY      BLADDER SURGERY  2016    Lesion removed    CARDIAC PROCEDURE N/A 02/12/2022    Left heart cath / coronary angiography performed by Sterling Rome Ned, MD at [facility] CARDIAC CATH LAB    KNEE SURGERY Right     PROSTATE BIOPSY N/A 09/08/2022    MRI FUSION PROSTATE BIOPSY performed by Cherylann Mungo, MD at [facility] MAIN OR    VASECTOMY  1994     Current Outpatient Medications   Medication Sig Dispense Refill    tamsulosin  (FLOMAX ) 0.4 MG capsule Take 1 capsule by mouth nightly 30 capsule 5    lamoTRIgine  (LAMICTAL ) 150 MG tablet Take 1 tablet by mouth daily      lithium  300 MG capsule Take 2 capsules by mouth daily      sertraline  (ZOLOFT ) 100 MG tablet Take 1.5 tablets by mouth daily      lurasidone  (LATUDA ) 80 MG TABS tablet Take 1 tablet by mouth      busPIRone  (BUSPAR ) 10 MG tablet Take 1 tablet by mouth 3 times daily      clonazePAM  (KLONOPIN ) 0.5 MG tablet Take 1 tablet by mouth 2 times daily as needed.       No current facility-administered medications for this visit.     Allergies   Allergen Reactions    Lisinopril      Other Reaction(s): Cough    Penicillins Rash     Other Reaction(s): Pharyngeal  spasm, Unknown    Topiramate Rash     Social History     Socioeconomic History    Marital status: Married     Spouse name: Not on file    Number of children: Not on file    Years of education: Not on file    Highest education level: Not on file   Occupational History    Not on file   Tobacco Use    Smoking status: Former     Current packs/day: 0.00     Average packs/day: 1 pack/day for 14.0 years (14.0 ttl pk-yrs)     Types: Cigarettes     Start date: 03/09/1994     Quit date: 2010     Years since quitting: 14.6     Passive exposure: Past    Smokeless tobacco: Never   Vaping Use    Vaping status: Never Used   Substance and Sexual Activity    Alcohol use: Not Currently    Drug use: Never  Sexual activity: Yes     Partners: Female   Other Topics Concern    Not on file   Social History Narrative    Not on file     Social Determinants of Health     Financial Resource Strain: Low Risk  (06/03/2022)    Received from [facility], [facility]    Overall Financial Resource Strain (CARDIA)     Difficulty of Paying Living Expenses: Not very hard   Food Insecurity: Food Insecurity Present (06/03/2022)    Received from [facility], [facility]    Hunger Vital Sign     Worried About Running Out of Food in the Last Year: Never true     Ran Out of Food in the Last Year: Sometimes true   Transportation Needs: No Transportation Needs (06/03/2022)    Received from [facility], [facility]    PRAPARE - Transportation     Lack of Transportation (Medical): No     Lack of Transportation (Non-Medical): No   Physical Activity: Insufficiently Active (06/03/2022)    Received from [facility], [facility]    Exercise Vital Sign     Days of Exercise per Week: 3 days     Minutes of Exercise per Session: 40 min   Stress: Stress Concern  Present (06/03/2022)    Received from [facility], [facility]    [facility] Occupational Health - Occupational Stress Questionnaire     Feeling of Stress : Rather much   Social Connections: Unknown (07/14/2022)    Received from [facility], [facility]    Social Connections     Frequency of Communication with Friends and Family: Not asked     Frequency of Social Gatherings with Friends and Family: Not asked   Intimate Partner Violence: Unknown (07/14/2022)    Received from [facility], [facility]    Intimate Partner Violence     Fear of Current or Ex-Partner: Not asked     Emotionally Abused: Not asked     Physically Abused: Not asked     Sexually Abused: Not asked   Housing Stability: Unknown (06/03/2022)    Received from [facility], [facility]    Housing Stability Vital Sign     Unable to Pay for Housing in the Last Year: No     Number of Places Lived in the Last Year: Not on file     Unstable Housing in the Last Year: No     Family History   Problem Relation Age of Onset    Heart Disease Father        Review of Systems  All systems reviewed and are negative at this time.    Physical Exam  There were no vitals taken for this visit.  General appearance - alert, well appearing, and in no distress  Mental status - alert and oriented  Eyes - extraocular eye movements intact, sclera anicteric  Nose - normal and patent, no erythema or discharge  Mouth - mucous membranes moist  Chest - clear to auscultation bilaterally  Heart - normal rate, regular rhythm  Abdomen - soft, nontender, nondistended, no masses or organomegaly  Neurological - normal speech, no focal findings or movement disorder noted  Skin - normal coloration and turgor        Assessment/Plan    ICD-10-CM  1. Prostate cancer (HCC)  C61         T1c.  He has elected to proceed with RALP and bilateral PLND.  All risks, benefits and  alternatives to the above mentioned procedure were discussed and the patient is willing to proceed at this time.    Aleaha Fickling P Gennesis Hogland, DO

## 2022-11-06 ENCOUNTER — Inpatient Hospital Stay: Admit: 2022-11-06 | Payer: TRICARE (CHAMPUS) | Primary: Family Medicine

## 2022-11-06 ENCOUNTER — Inpatient Hospital Stay: Admit: 2022-11-06 | Payer: PRIVATE HEALTH INSURANCE | Primary: Family Medicine

## 2022-11-06 VITALS — BP 119/86 | HR 92 | Temp 97.80000°F | Resp 18 | Ht 73.0 in | Wt 298.4 lb

## 2022-11-06 DIAGNOSIS — C61 Malignant neoplasm of prostate: Secondary | ICD-10-CM

## 2022-11-06 LAB — BASIC METABOLIC PANEL
Anion Gap: 12 mmol/L (ref 9–18)
BUN: 15 mg/dL (ref 6–23)
CO2: 24 mmol/L (ref 20–28)
Calcium: 9.4 mg/dL (ref 8.8–10.2)
Chloride: 102 mmol/L (ref 98–107)
Creatinine: 0.89 mg/dL (ref 0.80–1.30)
Est, Glom Filt Rate: >90 mL/min/{1.73_m2} (ref >60)
Glucose: 190 mg/dL — ABNORMAL HIGH (ref 70–99)
Potassium: 4.6 mmol/L (ref 3.5–5.1)
Sodium: 138 mmol/L (ref 136–145)

## 2022-11-06 LAB — CBC
Hematocrit: 47.1 % (ref 41.1–50.3)
Hemoglobin: 16.4 g/dL (ref 13.6–17.2)
MCH: 29.7 pg (ref 26.1–32.9)
MCHC: 34.8 g/dL (ref 31.4–35.0)
MCV: 85.2 FL (ref 82.0–102.0)
MPV: 9.8 FL (ref 9.4–12.3)
Platelets: 249 10*3/uL (ref 150–450)
RBC: 5.53 M/uL (ref 4.23–5.6)
RDW: 12.1 % (ref 11.9–14.6)
WBC: 9.4 10*3/uL (ref 4.3–11.1)
nRBC: 0 10*3/uL (ref 0.0–0.2)

## 2022-11-06 LAB — EKG 12-LEAD
Atrial Rate: 85 {beats}/min
Diagnosis: NORMAL
P Axis: 7 degrees
P-R Interval: 188 ms
Q-T Interval: 362 ms
QRS Duration: 88 ms
QTc Calculation (Bazett): 430 ms
R Axis: -23 degrees
T Axis: 32 degrees
Ventricular Rate: 85 {beats}/min

## 2022-11-06 LAB — APTT: APTT: 24.5 s (ref 23.3–37.4)

## 2022-11-06 LAB — URINALYSIS
Bilirubin, Urine: NEGATIVE
Blood, Urine: NEGATIVE
Glucose, Ur: NEGATIVE mg/dL
Ketones, Urine: NEGATIVE mg/dL
Leukocyte Esterase, Urine: NEGATIVE
Nitrite, Urine: NEGATIVE
Protein, UA: NEGATIVE mg/dL
Specific Gravity, UA: 1.012 (ref 1.001–1.023)
Urobilinogen, Urine: 0.2 EU/dL (ref 0.2–1.0)
pH, Urine: 6 (ref 5.0–9.0)

## 2022-11-06 LAB — PROTIME-INR
INR: 1
Protime: 12.5 s (ref 11.3–14.9)

## 2022-11-06 NOTE — Periop Note (Signed)
 Patient verified name and DOB    Order for consent found in EHR and matches case posting; patient verified.     Type 3 surgery, PAT walk-in assessment complete.    Labs per surgeon: CBC, BMP, APTT, PT/INR, UA, UCX, T/S held DOS, CXR; results pending  Labs per anesthesia protocol: POC Glucose DOS  EKG: 11/06/2022- No significant change was found    Patient provided with and instructed on educational handouts including Guide to Surgery, Preventing Surgical Site Infections, Pain Management, and [facility]ch[facility].    Patient answered medical/surgical history questions at their best of ability. All prior to admission medications documented in EPIC. Original medication prescription bottles visualized during patient appointment.     Patient instructed to hold all vitamins 7 days prior to surgery and NSAIDS 5 days prior to surgery, patient verbalized understanding.     Patient teach back successful and patient demonstrates knowledge of instructions.     PLEASE CONTINUE TAKING ALL PRESCRIPTION MEDICATIONS UP TO THE DAY OF SURGERY UNLESS OTHERWISE DIRECTED BELOW. You may take Tylenol , allergy,  and/or indigestion medications.     TAKE ONLY THESE MEDICATIONS ON THE DAY OF SURGERY      Buspirone     Carvedilol   Hydroxyzine  if needed   Sertraline   Clonazepam  if needed      DISCONTINUE all vitamins and supplements 7 days prior to surgery. DISCONTINUE Non-Steroidal Anti-Inflammatory (NSAIDS) such as Advil and Aleve 5 days prior to surgery.     Home Medications to Hold- please continue all other medications except these.            Comments   Dyna-Hex shower the night before and morning of surgery.     On the day before surgery (Sunday) please take 2 Tylenol  in the morning and then again before bed. You may use either regular or extra strength.           Please do not bring home medications with you on the day of surgery unless otherwise directed by your nurse.  If you are instructed to bring home medications, please  give them to your nurse as they will be administered by the nursing staff.    If you have any questions, please call [facility] [phone].    A copy of this note was provided to the patient for reference.

## 2022-11-06 NOTE — Other (Signed)
 Labs, CXR within anesthesia guidelines, no follow-up required. Labs automatically routed to ordering provider via Epic documentation.

## 2022-11-08 LAB — CULTURE, URINE: Culture: NO GROWTH

## 2022-11-16 ENCOUNTER — Inpatient Hospital Stay
Admit: 2022-11-16 | Discharge: 2022-11-17 | Disposition: A | Discharge status: Home / Self Care | Payer: PRIVATE HEALTH INSURANCE | Source: Ambulatory Visit | Attending: Urology | Admitting: Urology

## 2022-11-16 DIAGNOSIS — C61 Malignant neoplasm of prostate: Secondary | ICD-10-CM

## 2022-11-16 LAB — POCT GLUCOSE
POC Glucose: 202 mg/dL — ABNORMAL HIGH (ref 65–100)
POC Glucose: 255 mg/dL — ABNORMAL HIGH (ref 65–100)
POC Glucose: 236 mg/dL — ABNORMAL HIGH (ref 65–100)
POC Glucose: 216 mg/dL — ABNORMAL HIGH (ref 65–100)

## 2022-11-16 LAB — TYPE AND SCREEN
ABO/Rh: AB POS
Antibody Screen: NEGATIVE

## 2022-11-16 LAB — HEMOGLOBIN AND HEMATOCRIT
Hematocrit: 47.2 % (ref 41.1–50.3)
Hemoglobin: 15.1 g/dL (ref 13.6–17.2)

## 2022-11-16 MED ORDER — SODIUM CHLORIDE (PF) 0.9 % IJ SOLN
0.9 | INTRAMUSCULAR | Status: AC
Start: 2022-11-16 — End: ?

## 2022-11-16 MED ORDER — OXYBUTYNIN CHLORIDE 5 MG PO TABS
5 | Freq: Three times a day (TID) | ORAL | Status: DC | PRN
Start: 2022-11-16 — End: 2022-11-17

## 2022-11-16 MED ORDER — SERTRALINE HCL 50 MG PO TABS
50 | Freq: Every day | ORAL | Status: DC
Start: 2022-11-16 — End: 2022-11-17

## 2022-11-16 MED ORDER — NORMAL SALINE FLUSH 0.9 % IV SOLN
0.9 | Freq: Two times a day (BID) | INTRAVENOUS | Status: DC
Start: 2022-11-16 — End: 2022-11-16

## 2022-11-16 MED ORDER — LAMOTRIGINE 25 MG PO TABS
25 | Freq: Every evening | ORAL | Status: DC
Start: 2022-11-16 — End: 2022-11-17

## 2022-11-16 MED ORDER — DEXTROSE 10 % IV SOLN
10 | INTRAVENOUS | Status: DC | PRN
Start: 2022-11-16 — End: 2022-11-17

## 2022-11-16 MED ORDER — SODIUM CHLORIDE (PF) 0.9 % IJ SOLN
0.9 | INTRAMUSCULAR | Status: DC | PRN
Start: 2022-11-16 — End: 2022-11-16

## 2022-11-16 MED ORDER — ONDANSETRON HCL 4 MG/2ML IJ SOLN
4 | Freq: Four times a day (QID) | INTRAMUSCULAR | Status: DC | PRN
Start: 2022-11-16 — End: 2022-11-17

## 2022-11-16 MED ORDER — INSULIN REGULAR HUMAN 100 UNIT/ML IJ SOLN
100 | Freq: Once | INTRAMUSCULAR | Status: AC
Start: 2022-11-16 — End: 2022-11-16

## 2022-11-16 MED ORDER — HYDROMORPHONE HCL 2 MG/ML IJ SOLN
2 | INTRAMUSCULAR | Status: DC | PRN
Start: 2022-11-16 — End: 2022-11-16
  Administered 2022-11-16 (×3): .4 via INTRAVENOUS
  Administered 2022-11-16: 14:00:00 .2 via INTRAVENOUS

## 2022-11-16 MED ORDER — CEFAZOLIN 3000 MG IN 30 ML SWFI IV SYRINGE (PREMIX)
Freq: Three times a day (TID) | Status: AC
Start: 2022-11-16 — End: 2022-11-17

## 2022-11-16 MED ORDER — METFORMIN HCL 500 MG PO TABS
500 | Freq: Two times a day (BID) | ORAL | Status: DC
Start: 2022-11-16 — End: 2022-11-17

## 2022-11-16 MED ORDER — LITHIUM CARBONATE 300 MG PO CAPS
300 | Freq: Every evening | ORAL | Status: DC
Start: 2022-11-16 — End: 2022-11-17

## 2022-11-16 MED ORDER — LURASIDONE HCL 60 MG PO TABS
60 | Freq: Every evening | ORAL | Status: DC
Start: 2022-11-16 — End: 2022-11-17

## 2022-11-16 MED ORDER — ONDANSETRON HCL 4 MG/2ML IJ SOLN
4 | INTRAMUSCULAR | Status: DC | PRN
Start: 2022-11-16 — End: 2022-11-16

## 2022-11-16 MED ORDER — HYDROCODONE-ACETAMINOPHEN 5-325 MG PO TABS
5-325 | ORAL | Status: DC | PRN
Start: 2022-11-16 — End: 2022-11-17
  Administered 2022-11-16: 1 via ORAL

## 2022-11-16 MED ORDER — DEXAMETHASONE SODIUM PHOSPHATE 10 MG/ML IJ SOLN
10 | INTRAMUSCULAR | Status: AC
Start: 2022-11-16 — End: ?

## 2022-11-16 MED ORDER — BUSPIRONE HCL 10 MG PO TABS
10 | Freq: Three times a day (TID) | ORAL | Status: DC
Start: 2022-11-16 — End: 2022-11-17

## 2022-11-16 MED ORDER — ONDANSETRON HCL 4 MG/2ML IJ SOLN
4 | INTRAMUSCULAR | Status: AC
Start: 2022-11-16 — End: ?

## 2022-11-16 MED ORDER — ROCURONIUM BROMIDE 50 MG/5ML IV SOLN
50 | INTRAVENOUS | Status: AC
Start: 2022-11-16 — End: ?

## 2022-11-16 MED ORDER — DOCUSATE SODIUM 100 MG PO CAPS
100 | Freq: Two times a day (BID) | ORAL | Status: DC
Start: 2022-11-16 — End: 2022-11-17

## 2022-11-16 MED ORDER — DEXTROSE 10 % IV BOLUS
INTRAVENOUS | Status: DC | PRN
Start: 2022-11-16 — End: 2022-11-17

## 2022-11-16 MED ORDER — GLUCOSE 4 G PO CHEW
4 | ORAL | Status: DC | PRN
Start: 2022-11-16 — End: 2022-11-17

## 2022-11-16 MED ORDER — ACETAMINOPHEN 325 MG PO TABS
325 | ORAL | Status: DC | PRN
Start: 2022-11-16 — End: 2022-11-17

## 2022-11-16 MED ORDER — TRAZODONE HCL 50 MG PO TABS
50 | Freq: Every evening | ORAL | Status: DC
Start: 2022-11-16 — End: 2022-11-17

## 2022-11-16 MED ORDER — NORMAL SALINE FLUSH 0.9 % IV SOLN
0.9 | INTRAVENOUS | Status: DC | PRN
Start: 2022-11-16 — End: 2022-11-16

## 2022-11-16 MED ORDER — SODIUM CHLORIDE 0.9 % IV SOLN
0.9 | INTRAVENOUS | Status: DC
Start: 2022-11-16 — End: 2022-11-17

## 2022-11-16 MED ORDER — LIDOCAINE HCL (PF) 2 % IJ SOLN
2 | INTRAMUSCULAR | Status: DC | PRN
Start: 2022-11-16 — End: 2022-11-16

## 2022-11-16 MED ORDER — INSULIN LISPRO 100 UNIT/ML IJ SOLN
100 | Freq: Every evening | INTRAMUSCULAR | Status: DC
Start: 2022-11-16 — End: 2022-11-17

## 2022-11-16 MED ORDER — ACETAMINOPHEN 500 MG PO TABS
500 | Freq: Once | ORAL | Status: AC
Start: 2022-11-16 — End: 2022-11-16

## 2022-11-16 MED ORDER — SODIUM CHLORIDE 0.9 % IV SOLN
0.9 | INTRAVENOUS | Status: DC | PRN
Start: 2022-11-16 — End: 2022-11-16

## 2022-11-16 MED ORDER — SUCCINYLCHOLINE CHLORIDE 200 MG/10ML IV SOSY
200 | INTRAVENOUS | Status: AC
Start: 2022-11-16 — End: ?

## 2022-11-16 MED ORDER — DEXAMETHASONE SODIUM PHOSPHATE 10 MG/ML IJ SOLN
10 | INTRAMUSCULAR | Status: DC | PRN
Start: 2022-11-16 — End: 2022-11-16

## 2022-11-16 MED ORDER — PHENYLEPHRINE HCL PRESSORS PF 0.5 MG/5ML IV SOLN
0.5 | INTRAVENOUS | Status: AC
Start: 2022-11-16 — End: ?

## 2022-11-16 MED ORDER — LIDOCAINE HCL 1 % IJ SOLN
1 | Freq: Once | INTRAMUSCULAR | Status: DC | PRN
Start: 2022-11-16 — End: 2022-11-16

## 2022-11-16 MED ORDER — PROPOFOL 200 MG/20ML IV EMUL
200 | INTRAVENOUS | Status: AC
Start: 2022-11-16 — End: ?

## 2022-11-16 MED ORDER — CARVEDILOL 3.125 MG PO TABS
3.125 | Freq: Two times a day (BID) | ORAL | Status: DC
Start: 2022-11-16 — End: 2022-11-17

## 2022-11-16 MED ORDER — SUCCINYLCHOLINE CHLORIDE 200 MG/10ML IV SOSY
200 | INTRAVENOUS | Status: DC | PRN
Start: 2022-11-16 — End: 2022-11-16

## 2022-11-16 MED ORDER — CEFAZOLIN 3000 MG IN 30 ML SWFI IV SYRINGE (PREMIX)
Status: AC
Start: 2022-11-16 — End: 2022-11-16

## 2022-11-16 MED ORDER — NEOSTIGMINE METHYLSULFATE 3 MG/3ML IV SOSY
3 | INTRAVENOUS | Status: DC | PRN
Start: 2022-11-16 — End: 2022-11-16

## 2022-11-16 MED ORDER — INSULIN LISPRO 100 UNIT/ML IJ SOLN
100 | Freq: Two times a day (BID) | INTRAMUSCULAR | Status: DC
Start: 2022-11-16 — End: 2022-11-17

## 2022-11-16 MED ORDER — BUPIVACAINE HCL (PF) 0.25 % IJ SOLN
0.25 | INTRAMUSCULAR | Status: AC
Start: 2022-11-16 — End: ?

## 2022-11-16 MED ORDER — MIDAZOLAM HCL (PF) 2 MG/2ML IJ SOLN
2 | Freq: Once | INTRAMUSCULAR | Status: DC | PRN
Start: 2022-11-16 — End: 2022-11-16

## 2022-11-16 MED ORDER — GLYCOPYRROLATE 0.4 MG/2ML IJ SOLN
0.4 | INTRAMUSCULAR | Status: AC
Start: 2022-11-16 — End: ?

## 2022-11-16 MED ORDER — HYDROMORPHONE HCL PF 2 MG/ML IJ SOLN
2 | INTRAMUSCULAR | Status: DC | PRN
Start: 2022-11-16 — End: 2022-11-16

## 2022-11-16 MED ORDER — ROCURONIUM BROMIDE 50 MG/5ML IV SOLN
50 | INTRAVENOUS | Status: DC | PRN
Start: 2022-11-16 — End: 2022-11-16

## 2022-11-16 MED ORDER — HYDROMORPHONE HCL 2 MG/ML IJ SOLN
2 | INTRAMUSCULAR | Status: AC
Start: 2022-11-16 — End: ?

## 2022-11-16 MED ORDER — HYDROXYZINE HCL 25 MG PO TABS
25 | Freq: Three times a day (TID) | ORAL | Status: DC | PRN
Start: 2022-11-16 — End: 2022-11-17

## 2022-11-16 MED ORDER — GLYCOPYRROLATE 0.4 MG/2ML IJ SOLN
0.4 | INTRAMUSCULAR | Status: DC | PRN
Start: 2022-11-16 — End: 2022-11-16

## 2022-11-16 MED ORDER — PROCHLORPERAZINE EDISYLATE 10 MG/2ML IJ SOLN
10 | Freq: Once | INTRAMUSCULAR | Status: DC | PRN
Start: 2022-11-16 — End: 2022-11-16

## 2022-11-16 MED ORDER — PROPOFOL 200 MG/20ML IV EMUL
200 | INTRAVENOUS | Status: DC | PRN
Start: 2022-11-16 — End: 2022-11-16

## 2022-11-16 MED ORDER — OXYCODONE HCL 5 MG PO TABS
5 | Freq: Once | ORAL | Status: DC | PRN
Start: 2022-11-16 — End: 2022-11-16

## 2022-11-16 MED ORDER — PHENYLEPHRINE HCL PRESSORS PF 0.5 MG/5ML IV SOLN
0.5 | INTRAVENOUS | Status: DC | PRN
Start: 2022-11-16 — End: 2022-11-16

## 2022-11-16 MED ORDER — GLUCAGON EMERGENCY 1 MG IJ KIT
1 | INTRAMUSCULAR | Status: DC | PRN
Start: 2022-11-16 — End: 2022-11-17

## 2022-11-16 MED ORDER — MORPHINE SULFATE 2 MG/ML IJ SOLN
2 | INTRAMUSCULAR | Status: DC | PRN
Start: 2022-11-16 — End: 2022-11-17

## 2022-11-16 MED ORDER — LACTATED RINGERS IV SOLN
INTRAVENOUS | Status: DC
Start: 2022-11-16 — End: 2022-11-16

## 2022-11-16 MED ORDER — CLONAZEPAM 0.5 MG PO TABS
0.5 | Freq: Two times a day (BID) | ORAL | Status: DC | PRN
Start: 2022-11-16 — End: 2022-11-17

## 2022-11-16 MED ADMIN — HYDROmorphone HCl PF (DILAUDID) injection 0.5 mg: 0.5 mg | INTRAVENOUS | @ 18:00:00 | NDC 00641615101

## 2022-11-16 MED ADMIN — ceFAZolin (ANCEF) 3000 mg in sterile water 30 mL IV syringe: 3000 mg | INTRAVENOUS | @ 22:00:00 | NDC 09999524630

## 2022-11-16 MED ADMIN — busPIRone (BUSPAR) tablet 10 mg: 10 mg | ORAL | NDC 00904712161

## 2022-11-16 MED ADMIN — rocuronium (ZEMURON) injection: 10 | INTRAVENOUS | @ 16:00:00 | NDC 63323042602

## 2022-11-16 MED ADMIN — lidocaine PF 2 % injection: 60 | INTRAVENOUS | @ 15:00:00 | NDC 63323049526

## 2022-11-16 MED ADMIN — rocuronium (ZEMURON) injection: 20 | INTRAVENOUS | @ 16:00:00 | NDC 63323042602

## 2022-11-16 MED ADMIN — lactated ringers IV soln infusion: INTRAVENOUS | @ 17:00:00 | NDC 00990795309

## 2022-11-16 MED ADMIN — *Unknown: 5 | INTRAVENOUS | @ 17:00:00 | NDC 69374093203

## 2022-11-16 MED ADMIN — insulin lispro (HUMALOG,ADMELOG) injection vial 0-8 Units: 2 [IU] | SUBCUTANEOUS | @ 22:00:00 | NDC 00002773701

## 2022-11-16 MED ADMIN — rocuronium (ZEMURON) injection: 20 | INTRAVENOUS | @ 15:00:00 | NDC 63323042602

## 2022-11-16 MED ADMIN — metFORMIN (GLUCOPHAGE) tablet 500 mg: 500 mg | ORAL | @ 22:00:00 | NDC 00904716261

## 2022-11-16 MED ADMIN — *Unknown: 600 | ORAL | NDC 00054852725

## 2022-11-16 MED ADMIN — *Unknown: 100 | ORAL | NDC 60687012911

## 2022-11-16 MED ADMIN — phenylephrine (BIORPHEN) injection: 100 | INTRAVENOUS | @ 17:00:00 | NDC 43598017205

## 2022-11-16 MED ADMIN — rocuronium (ZEMURON) injection: 5 | INTRAVENOUS | @ 15:00:00 | NDC 63323042602

## 2022-11-16 MED ADMIN — propofol infusion: 50 | INTRAVENOUS | @ 17:00:00 | NDC 63323026922

## 2022-11-16 MED ADMIN — propofol infusion: 200 | INTRAVENOUS | @ 15:00:00 | NDC 63323026922

## 2022-11-16 MED ADMIN — ceFAZolin (ANCEF) 3000 mg in sterile water 30 mL IV syringe: 3000 mg | INTRAVENOUS | @ 15:00:00 | NDC 09999524630

## 2022-11-16 MED ADMIN — busPIRone (BUSPAR) tablet 10 mg: 10 mg | ORAL | @ 22:00:00 | NDC 00904712161

## 2022-11-16 MED ADMIN — lactated ringers IV soln infusion: INTRAVENOUS | @ 13:00:00 | NDC 00338011704

## 2022-11-16 MED ADMIN — glycopyrrolate (ROBINUL) injection: .8 | INTRAVENOUS | @ 17:00:00 | NDC 71288041402

## 2022-11-16 MED ADMIN — dexAMETHasone (DECADRON) injection: 8 | INTRAVENOUS | @ 15:00:00 | NDC 00641036721

## 2022-11-16 MED ADMIN — carvedilol (COREG) tablet 3.125 mg: 3.125 mg | ORAL | @ 22:00:00 | NDC 68382009201

## 2022-11-16 MED ADMIN — ondansetron (ZOFRAN) injection: 4 | INTRAVENOUS | @ 15:00:00 | NDC 23155054831

## 2022-11-16 MED ADMIN — acetaminophen (TYLENOL) tablet 1,000 mg: 1000 mg | ORAL | @ 13:00:00 | NDC 50580045711

## 2022-11-16 MED ADMIN — succinylcholine (ANECTINE) injection: 200 | INTRAVENOUS | @ 15:00:00 | NDC 71266200102

## 2022-11-16 MED ADMIN — propofol infusion: 50 | INTRAVENOUS | @ 16:00:00 | NDC 63323026922

## 2022-11-16 MED ADMIN — rocuronium (ZEMURON) injection: 45 | INTRAVENOUS | @ 15:00:00 | NDC 63323042602

## 2022-11-16 MED ADMIN — *Unknown: 150 | ORAL | NDC 00904686861

## 2022-11-16 MED ADMIN — 0.9 % sodium chloride infusion: INTRAVENOUS | @ 22:00:00 | NDC 00338004904

## 2022-11-16 MED ADMIN — insulin regular (HumuLIN R;NovoLIN R) injection 2 Units: 2 [IU] | SUBCUTANEOUS | @ 13:00:00 | NDC 00002021301

## 2022-11-16 MED ADMIN — *Unknown: 10 | INTRAVENOUS | @ 14:00:00 | NDC 63323018601

## 2022-11-16 MED FILL — SUCCINYLCHOLINE CHLORIDE 200 MG/10ML IV SOSY: 200 MG/10ML | INTRAVENOUS | Qty: 10

## 2022-11-16 MED FILL — GLYCOPYRROLATE 0.4 MG/2ML IJ SOLN: 0.4 MG/2ML | INTRAMUSCULAR | Qty: 2

## 2022-11-16 MED FILL — TRAZODONE HCL 50 MG PO TABS: 50 MG | ORAL | Qty: 3

## 2022-11-16 MED FILL — BUSPIRONE HCL 10 MG PO TABS: 10 MG | ORAL | Qty: 1

## 2022-11-16 MED FILL — CEFAZOLIN 3000 MG IN 30 ML SWFI IV SYRINGE (PREMIX): Qty: 3000

## 2022-11-16 MED FILL — LAMOTRIGINE 25 MG PO TABS: 25 MG | ORAL | Qty: 3

## 2022-11-16 MED FILL — LITHIUM CARBONATE 300 MG PO CAPS: 300 MG | ORAL | Qty: 2

## 2022-11-16 MED FILL — SENSORCAINE-MPF 0.25 % IJ SOLN: 0.25 % | INTRAMUSCULAR | Qty: 30

## 2022-11-16 MED FILL — DIPRIVAN 200 MG/20ML IV EMUL: 200 MG/20ML | INTRAVENOUS | Qty: 20

## 2022-11-16 MED FILL — HYDROMORPHONE HCL 2 MG/ML IJ SOLN: 2 MG/ML | INTRAMUSCULAR | Qty: 1

## 2022-11-16 MED FILL — INSULIN LISPRO 100 UNIT/ML IJ SOLN: 100 UNIT/ML | INTRAMUSCULAR | Qty: 3

## 2022-11-16 MED FILL — METFORMIN HCL 500 MG PO TABS: 500 MG | ORAL | Qty: 1

## 2022-11-16 MED FILL — HYDROCODONE-ACETAMINOPHEN 5-325 MG PO TABS: 5-325 MG | ORAL | Qty: 1

## 2022-11-16 MED FILL — ROCURONIUM BROMIDE 50 MG/5ML IV SOLN: 50 MG/5ML | INTRAVENOUS | Qty: 5

## 2022-11-16 MED FILL — DEXAMETHASONE SODIUM PHOSPHATE 10 MG/ML IJ SOLN: 10 MG/ML | INTRAMUSCULAR | Qty: 1

## 2022-11-16 MED FILL — CEFAZOLIN SODIUM 10 G IJ SOLR: 10 g | INTRAMUSCULAR | Qty: 3000

## 2022-11-16 MED FILL — BIORPHEN 0.5 MG/5ML IV SOLN: 0.5 MG/5ML | INTRAVENOUS | Qty: 5

## 2022-11-16 MED FILL — ONDANSETRON HCL 4 MG/2ML IJ SOLN: 4 MG/2ML | INTRAMUSCULAR | Qty: 2

## 2022-11-16 MED FILL — ACETAMINOPHEN EXTRA STRENGTH 500 MG PO TABS: 500 MG | ORAL | Qty: 2

## 2022-11-16 MED FILL — CARVEDILOL 3.125 MG PO TABS: 3.125 MG | ORAL | Qty: 1

## 2022-11-16 MED FILL — HUMULIN R 100 UNIT/ML IJ SOLN: 100 UNIT/ML | INTRAMUSCULAR | Qty: 2

## 2022-11-16 MED FILL — DOCUSATE SODIUM 100 MG PO CAPS: 100 MG | ORAL | Qty: 1

## 2022-11-16 MED FILL — SODIUM CHLORIDE (PF) 0.9 % IJ SOLN: 0.9 % | INTRAMUSCULAR | Qty: 10

## 2022-11-16 NOTE — Op Note (Signed)
 [facility]              [address] [place], [place]  [address]                            OPERATIVE REPORT      PATIENT NAME: Javier James, Javier James     DOB: 07-23-1965  MED REC NO: [id number]                       ROOM: MOR  ACCOUNT NO: [id number]                       ADMIT DATE: 11/16/2022  PROVIDER: Jayson SQUIBB Cloy Cozzens, DO    DATE OF SERVICE:  11/16/2022    PREOPERATIVE DIAGNOSES:  Prostate cancer.    POSTOPERATIVE DIAGNOSES:  Prostate cancer.    PROCEDURES PERFORMED:  Robotic-assisted laparoscopic radical prostatectomy and bilateral pelvic lymph node.    SURGEON:  Jayson SQUIBB Sabrina Keough, DO    ASSISTANT:  None.    ANESTHESIA:  General.    ESTIMATED BLOOD LOSS:  75 mL.    SPECIMENS REMOVED:  Prostate and bilateral pelvic lymph nodes.    INTRAOPERATIVE FINDINGS:  Please see dictated operative note.     COMPLICATIONS:  None immediate.    IMPLANTS:  None.    INDICATIONS:  This is a 57 year old gentleman who was recently diagnosed with Gleason 7 and 8 adenocarcinoma of the prostate in all right-sided biopsy cores on 07/20/2022.  His pre biopsy PSA was 6.87.  All risks, benefits, and alternatives of above-mentioned procedure have been discussed and he is willing to proceed at this time.    DESCRIPTION OF PROCEDURE:  The patient's consent was obtained.  The patient was brought back to the operating room, at which time he was placed in the supine position.  After the uneventful induction of general anesthesia, he was then placed in a low lithotomy position.  His genital and abdominal areas were prepped and draped and a sterile field applied.  An 18-French Foley catheter was placed to dependent drainage.  A small periumbilical incision was made and a Veress needle was inserted into the abdominal cavity without event.  The abdomen was then insufflated with CO2.  Ports were then placed in a standard robotic prostatectomy fashion under direct vision.  The patient was then placed in a steep  Trendelenburg position and the robot was docked.  The lateral sigmoid colonic attachments were taken down sharply in order to evacuate the bowel out of the pelvis.  The space of Retzius was entered by dropping the bladder posteriorly.  The endopelvic fascia was incised bilaterally.  The dorsal venous complex was dissected out and oversewn using 2-0 Vicryl in a figure-of-eight fashion.  A back stitch was also placed near the bladder neck.  The bladder neck was then transected from the prostate using electrocautery.  The catheter was identified in the midline, retracted anteriorly to aid in posterior dissection.  Following division of the posterior bladder neck dissection, I then carried posteriorly to the seminal vesicles and vas deferens bilaterally.  These structures were dissected out and divided.  A bilateral retrograde nerve-sparing procedure was then performed using athermal technique.  Pedicles were controlled using Lapra clips once again utilizing athermal technique.  The neurovascular bundles on the rectum were then swept off the prostate bluntly in an antegrade fashion.  The dorsal vein was divided  using electrocautery and the urethra was transected using the robotic scissors.  The prostate was then placed with the EndoCatch bag and placed within the lower pelvis.  The pelvis was thoroughly irrigated.  No active bleeding was seen.  The urethrovesical anastomosis was accomplished using 3-0 double-armed Monocryl in a running fashion.  A 20-French Foley catheter was placed at the completion of this anastomosis.  The bladder was thoroughly irrigated.  No obvious leak was seen.  The bladder was then tacked anteriorly to the endopelvic fascia bilaterally using 3-0 Vicryl in a simple interrupted fashion.  A bilateral pelvic lymph node dissection was then performed.  Boundaries of my dissection included the inguinal ligament inferiorly, bifurcation of the common iliac artery superiorly, pelvic sidewall laterally  and bladder medially.  All noted tissue within this plane was removed.  Care was taken to avoid injury to all vascular structures as well as the obturator nerve.  Following extraction of the noted tissue, the intra-abdominal pressures were lowered and no active bleeding was seen.  No injury was seen in the lower pelvis.  The intra-abdominal pressures were raised and the robot was undocked.  The prostate was removed through periumbilical incision and a 15 JP drain was placed through the 5 mm port site and sewn in place using 2-0 nylon suture.  The midline fascia was closed using 0 Vicryl in a running fashion.  Skin was closed using 4-0 Monocryl in a running subcuticular fashion.  All other port sites were closed using Dermabond.  The patient tolerated the procedure well.  Estimated blood loss was 75 mL.  He is transferred to the PACU in stable condition.        JAYSON SHAUNNA ACHILLES, DO      SPS/AQS  D:  11/16/2022 13:02:16  T:  11/16/2022 13:25:32  JOB #:  [id number]

## 2022-11-16 NOTE — Plan of Care (Signed)
 Problem: Chronic Conditions and Co-morbidities  Goal: Patient's chronic conditions and co-morbidity symptoms are monitored and maintained or improved  Outcome: Progressing     Problem: Safety - Adult  Goal: Free from fall injury  Outcome: Progressing     Problem: Pain  Goal: Verbalizes/displays adequate comfort level or baseline comfort level  Outcome: Progressing     Problem: Discharge Planning  Goal: Discharge to home or other facility with appropriate resources  Outcome: Progressing  Flowsheets (Taken 11/16/2022 1655)  Discharge to home or other facility with appropriate resources:   Identify barriers to discharge with patient and caregiver   Arrange for needed discharge resources and transportation as appropriate     Problem: ABCDS Injury Assessment  Goal: Absence of physical injury  Outcome: Progressing

## 2022-11-16 NOTE — Anesthesia Pre Procedure (Addendum)
 Department of Anesthesiology  Preprocedure Note       Name:  Javier James   Age:  57 y.o.  DOB:  [birth year]                                          MRN:  [id number]         Date:  11/16/2022      Surgeon: Clotilde):  Sterrett, Jayson SQUIBB, DO    Procedure: Procedure(s):  PROSTATECTOMY LAPAROSCOPIC ROBOTIC,POSSIBLE BILATERAL LYMPH NODE DISSECTION TO THE PROSTATE    Medications prior to admission:   Prior to Admission medications    Medication Sig Start Date End Date Taking? Authorizing Provider   lamoTRIgine  (LAMICTAL ) 25 MG tablet Take 3 tablets by mouth at bedtime   Yes [provider]   sertraline  (ZOLOFT ) 50 MG tablet Take 3 tablets by mouth daily   Yes [provider]   lurasidone  (LATUDA ) 60 MG TABS tablet Take 1 tablet by mouth at bedtime   Yes [provider]   traZODone  (DESYREL ) 50 MG tablet Take 3 tablets by mouth nightly   Yes [provider]   hydrOXYzine  HCl (ATARAX ) 50 MG tablet Take 1 tablet by mouth 3 times daily as needed for Itching   Yes [provider]   metFORMIN  (GLUCOPHAGE ) 500 MG tablet Take 1 tablet by mouth 2 times daily (with meals) 10/08/22 10/08/23 Yes [provider]   carvedilol  (COREG ) 3.125 MG tablet Take 1 tablet by mouth 2 times daily (with meals)   Yes [provider]   lithium  300 MG capsule Take 2 capsules by mouth at bedtime 10/01/21  Yes [provider]   busPIRone  (BUSPAR ) 10 MG tablet Take 1 tablet by mouth 3 times daily   Yes [provider]   clonazePAM  (KLONOPIN ) 0.5 MG tablet Take 1 tablet by mouth 2 times daily as needed.   Yes [provider]   tamsulosin  (FLOMAX ) 0.4 MG capsule Take 1 capsule by mouth nightly 07/01/22   Fontenot, Philip, MD       Current medications:    Current Facility-Administered Medications   Medication Dose Route Frequency Provider Last Rate Last Admin   . lidocaine  1 % injection 1 mL  1 mL IntraDERmal Once PRN Cristobal Dorn SAUNDERS, MD       . acetaminophen   (TYLENOL ) tablet 1,000 mg  1,000 mg Oral Once Cristobal Dorn SAUNDERS, MD       . lactated ringers  IV soln infusion   IntraVENous Continuous Cristobal Dorn SAUNDERS, MD 125 mL/hr at 11/16/22 0840 New Bag at 11/16/22 0840   . sodium chloride  flush 0.9 % injection 5-40 mL  5-40 mL IntraVENous 2 times per day Cristobal Dorn SAUNDERS, MD       . sodium chloride  flush 0.9 % injection 5-40 mL  5-40 mL IntraVENous PRN Cristobal Dorn SAUNDERS, MD       . 0.9 % sodium chloride  infusion   IntraVENous PRN Cristobal Dorn SAUNDERS, MD       . midazolam  PF (VERSED ) injection 2 mg  2 mg IntraVENous Once PRN Cristobal Dorn SAUNDERS, MD       . ceFAZolin  (ANCEF ) 3000 mg in sterile water  30 mL IV syringe  3,000 mg IntraVENous On Call to OR Sterrett, Jayson SQUIBB, DO           Allergies:    Allergies  Allergen Reactions   . Lisinopril      Other Reaction(s): Cough   . Penicillins Rash     Other Reaction(s): Pharyngeal spasm, Unknown   . Topiramate Other (See Comments)     When I take it, I'm up for 3 or 4 days       Problem List:    Patient Active Problem List   Diagnosis Code   . Accelerating angina (HCC) I20.0   . Chest discomfort R07.89   . Coronary artery calcification seen on CT scan I25.10   . 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 Elevated PSA R97.20   . Prostate cancer (HCC) C61       Past Medical History:        Diagnosis Date   . CAD (coronary artery disease)     10% LAD   . Elevated PSA    . Hypertension    . Prostate cancer (HCC)    . PTSD (post-traumatic stress disorder)    . Sleep apnea     uses cpap hs   . Type 2 diabetes mellitus (HCC)     oral reliant; AVG BS 80-100; denies hypo; last A1c       Past Surgical History:        Procedure Laterality Date   . APPENDECTOMY     . BLADDER SURGERY  2016    Lesion removed   . CARDIAC PROCEDURE N/A 02/12/2022    Left heart cath / coronary angiography performed by Siachos, Rome Ned, MD at [facility] CARDIAC CATH LAB   . KNEE SURGERY  Right    . PROSTATE BIOPSY N/A 09/08/2022    MRI FUSION PROSTATE BIOPSY performed by Cherylann Mungo, MD at [facility] MAIN OR   . VASECTOMY  1994       Social History:    Social History     Tobacco Use   . Smoking status: Former     Current packs/day: 0.00     Average packs/day: 1 pack/day for 14.0 years (14.0 ttl pk-yrs)     Types: Cigarettes     Start date: 03/09/1994     Quit date: 2010     Years since quitting: 14.6     Passive exposure: Past   . Smokeless tobacco: Never   Substance Use Topics   . Alcohol use: Not Currently                                Counseling given: Not Answered      Vital Signs (Current):   Vitals:    11/16/22 0824 11/16/22 0826   BP:  (!) 142/88   Pulse:  74   Resp:  18   Temp:  97.7 F (36.5 C)   TempSrc:  Oral   SpO2:  96%   Weight:  135.2 kg (298 lb)   Height: 1.854 m (6' 1) 1.854 m (6' 1)                                              BP Readings from Last 3 Encounters:   11/16/22 (!) 142/88   11/06/22 119/86   09/08/22 117/61       NPO Status: Time of last liquid consumption: 0000  Time of last solid consumption: 0000                        Date of last liquid consumption: 11/15/22                        Date of last solid food consumption: 11/15/22    BMI:   Wt Readings from Last 3 Encounters:   11/16/22 135.2 kg (298 lb)   11/06/22 135.4 kg (298 lb 6.4 oz)   09/08/22 130.2 kg (287 lb)     Body mass index is 39.32 kg/m.    CBC:   Lab Results   Component Value Date/Time    WBC 9.4 11/06/2022 11:36 AM    RBC 5.53 11/06/2022 11:36 AM    HGB 16.4 11/06/2022 11:36 AM    HCT 47.1 11/06/2022 11:36 AM    MCV 85.2 11/06/2022 11:36 AM    RDW 12.1 11/06/2022 11:36 AM    PLT 249 11/06/2022 11:36 AM       CMP:   Lab Results   Component Value Date/Time    NA 138 11/06/2022 11:36 AM    K 4.6 11/06/2022 11:36 AM    CL 102 11/06/2022 11:36 AM    CO2 24 11/06/2022 11:36 AM    BUN 15 11/06/2022 11:36 AM    CREATININE 0.89 11/06/2022 11:36 AM    LABGLOM >90 11/06/2022 11:36 AM     LABGLOM >60 02/09/2022 11:07 AM    GLUCOSE 190 11/06/2022 11:36 AM    CALCIUM 9.4 11/06/2022 11:36 AM    BILITOT 1.0 02/09/2022 11:07 AM    ALKPHOS 81 02/09/2022 11:07 AM    AST 44 02/09/2022 11:07 AM    ALT 77 02/09/2022 11:07 AM       POC Tests: No results for input(s): POCGLU, POCNA, POCK, POCCL, POCBUN, POCHEMO, POCHCT in the last 72 hours.    Coags:   Lab Results   Component Value Date/Time    PROTIME 12.5 11/06/2022 11:36 AM    INR 1.0 11/06/2022 11:36 AM    APTT 24.5 11/06/2022 11:36 AM       HCG (If Applicable): No results found for: PREGTESTUR, PREGSERUM, HCG, HCGQUANT     ABGs: No results found for: PHART, PO2ART, PCO2ART, HCO3ART, BEART, O2SATART     Type & Screen (If Applicable):  No results found for: LABABO    Drug/Infectious Status (If Applicable):  No results found for: HIV, HEPCAB    COVID-19 Screening (If Applicable): No results found for: COVID19        Anesthesia Evaluation  Patient summary reviewed and Nursing notes reviewed  Airway: Mallampati: III  TM distance: >3 FB   Neck ROM: full  Comment: Short thick neck  Mouth opening: > = 3 FB   Dental: normal exam         Pulmonary:Negative Pulmonary ROS and normal exam  breath sounds clear to auscultation  (+)     sleep apnea: on CPAP,                                  Cardiovascular:  Exercise tolerance: good (>4 METS)  (+) hypertension:        Rhythm: regular  Rate: normal                 ROS comment: Echo 5/24:  Mild nonobstructive coronary disease  with normal LV function and elevated end-diastolic pressure.      Cath 12/23:  Mild nonobstructive coronary disease with normal LV function and elevated end-diastolic pressure.         Neuro/Psych:   Negative Neuro/Psych ROS  (+) psychiatric history:            GI/Hepatic/Renal:   (+) morbid obesity          Endo/Other: Negative Endo/Other ROS   (+) DiabetesType II DM, malignancy/cancer (prostate).                 Abdominal:             Vascular: negative  vascular ROS.         Other Findings:       Anesthesia Plan      general     ASA 3     (Glidescope for intubation.)  Induction: intravenous.      Anesthetic plan and risks discussed with patient.                    Stormee Duda R Isaih Bulger, MD   11/16/2022

## 2022-11-16 NOTE — Progress Notes (Signed)
 4 Eyes Skin Assessment     NAME:  Masaru Chamberlin  DATE OF BIRTH:  1965-08-24  MEDICAL RECORD NUMBER:  [id number]    The patient is being assessed for  Admission    I agree that at least one RN has performed a thorough Head to Toe Skin Assessment on the patient. ALL assessment sites listed below have been assessed.      Areas assessed by both nurses:    Head, Face, Ears, Shoulders, Back, Chest, Arms, Elbows, Hands, Sacrum. Buttock, Coccyx, Ischium, and Legs. Feet and Heels        Does the Patient have a Wound? No noted wound(s)       Braden Prevention initiated by RN: No  Wound Care Orders initiated by RN: No    Pressure Injury (Stage 3,4, Unstageable, DTI, NWPT, and Complex wounds) if present, place Wound referral order by RN under ORDER ENTRY: No    New Ostomies, if present place, Ostomy referral order under ORDER ENTRY: No     Nurse 1 eSignature: Electronically signed by DAN LENIS, RN on 11/16/22 at 6:27 PM EDT    **SHARE this note so that the co-signing nurse can place an eSignature**    Nurse 2 eSignature: Electronically signed by Ellouise LITTIE Bless, RN on 11/16/22 at 7:42 PM EDT

## 2022-11-16 NOTE — Progress Notes (Signed)
 Pt arrived the floor via stretcher, alerted and oriented X4, patient appears comfortable, was able to pull himself from stretcher to bed self assisted, foley patent, insertion site pink, no trauma noted, urine cherry color clear, no clots noted, abdomen slightly distended, hypoactive bowel sounds lower quadrants, incision sites glued, no bleeding noted, JP drain ball suction, sanguineous drainage.

## 2022-11-16 NOTE — Anesthesia Postprocedure Evaluation (Signed)
 Department of Anesthesiology  Postprocedure Note    Patient: Javier James  MRN: [id number]  Birthdate: 1965-09-04  Date of evaluation: 11/16/2022    Procedure Summary       Date: 11/16/22 Room / Location: [facility] MA[facility] OR 11 / [facility] MA[facility] OR    Anesthesia Start: 1018 Anesthesia Stop: 1300    Procedure: PROSTATECTOMY LAPAROSCOPIC ROBOTIC,POSSIBLE BILATERAL LYMPH NODE DISSECTION TO THE PROSTATE (Bilateral: Abdomen) Diagnosis:       Prostate cancer (HCC)      (Prostate cancer (HCC) [C61])    Providers: Debbora Jayson SQUIBB, DO Responsible Provider: Cristobal Dorn SAUNDERS, MD    Anesthesia Type: General ASA Status: 3            Anesthesia Type: General    Aldrete Phase I: Aldrete Score: 8    Aldrete Phase II:      Anesthesia Post Evaluation    Patient location during evaluation: PACU  Patient participation: complete - patient participated  Level of consciousness: awake and alert  Airway patency: patent  Nausea & Vomiting: no nausea and no vomiting  Cardiovascular status: hemodynamically stable  Respiratory status: acceptable, nonlabored ventilation and spontaneous ventilation  Hydration status: euvolemic  Comments: BP 138/86   Pulse 63   Temp 97.2 F (36.2 C) (Tympanic)   Resp 15   Ht 1.854 m (6' 1)   Wt 135.2 kg (298 lb)   SpO2 95%   BMI 39.32 kg/m     Multimodal analgesia pain management approach  Pain management: adequate and satisfactory to patient    No notable events documented.

## 2022-11-16 NOTE — Progress Notes (Signed)
 TRANSFER - IN REPORT:    Verbal report received from [place]  on Javier James  being received from PACU for routine post-op      Report consisted of patient's Situation, Background, Assessment and   Recommendations(SBAR).     Information from the following report(s) Nurse Handoff Report was reviewed with the receiving nurse.    Opportunity for questions and clarification was provided.      Assessment completed upon patient's arrival to unit and care assumed.

## 2022-11-16 NOTE — Brief Op Note (Signed)
 Brief Postoperative Note      Patient: Javier James  Date of Birth: November 27, 1965  MRN: [id number]    Date of Procedure: December 06, 2022    Pre-Op Diagnosis Codes:      * Prostate cancer (HCC) [C61]    Post-Op Diagnosis: Same    Procedure:  RALP and bilateral PLND    Surgeon(s):  Locklan Canoy, Jayson SQUIBB, DO    Assistant:  * No surgical staff found *    Anesthesia: General    Estimated Blood Loss (mL): 75cc    Complications: none immediate    Specimens:   ID Type Source Tests Collected by Time Destination   A : PROSTATE Tissue Prostate SURGICAL PATHOLOGY Debbora Jayson SQUIBB, DO December 06, 2022 1130    B : RIGHT PELVIC LYMPH NODE Tissue Lymph Node SURGICAL PATHOLOGY Debbora Jayson SQUIBB, DO 12/06/22 1144    C : LEFT PELVIC LYMPH NODE Tissue Lymph Node SURGICAL PATHOLOGY Debbora Jayson SQUIBB, DO 12-06-2022 1145        Implants:  Implant Name Type Inv. Item Serial No. Manufacturer Lot No. LRB No. Used Action   CLIP INT BLK SGL ABSRB LIG LAPRO - [id number]  CLIP INT BLK SGL ABSRB LIG LAPRO  [employer] COVIDIEN ENDO-WD [id number]  1 Implanted         Drains:   Closed/Suction Drain Lateral RUQ Bulb (Active)   Dressing Status Other (Comment) 12/06/22 1133       Urinary Catheter 12-06-22 2 Way (Active)         Electronically signed by JAYSON SQUIBB DEBBORA, DO on 12/06/2022 at 12:45 PM

## 2022-11-16 NOTE — Periop Note (Signed)
 TRANSFER - OUT REPORT:    Verbal report given to Nelsa, RN on Khaled Herda  being transferred to 612 for routine progression of patient care       Report consisted of patient's Situation, Background, Assessment and   Recommendations(SBAR).     Information from the following report(s) Nurse Handoff Report, Adult Overview, Surgery Report, Intake/Output, and MAR was reviewed with the receiving nurse.           Lines:   Peripheral IV 11/16/22 Right Hand (Active)   Site Assessment Clean, dry & intact 11/16/22 1300   Line Status Infusing 11/16/22 1300   Phlebitis Assessment No symptoms 11/16/22 1300   Infiltration Assessment 0 11/16/22 1300   Dressing Status Clean, dry & intact 11/16/22 1300   Dressing Type Transparent 11/16/22 1300        Opportunity for questions and clarification was provided.      Patient transported with:  O2 @ 2lpm

## 2022-11-17 LAB — HEMOGLOBIN AND HEMATOCRIT
Hematocrit: 43 % (ref 41.1–50.3)
Hemoglobin: 14 g/dL (ref 13.6–17.2)

## 2022-11-17 LAB — POCT GLUCOSE
POC Glucose: 159 mg/dL — ABNORMAL HIGH (ref 65–100)
POC Glucose: 189 mg/dL — ABNORMAL HIGH (ref 65–100)

## 2022-11-17 LAB — BASIC METABOLIC PANEL
Anion Gap: 10 mmol/L (ref 9–18)
BUN: 16 mg/dL (ref 6–23)
CO2: 26 mmol/L (ref 20–28)
Calcium: 8.5 mg/dL — ABNORMAL LOW (ref 8.8–10.2)
Chloride: 103 mmol/L (ref 98–107)
Creatinine: 0.92 mg/dL (ref 0.80–1.30)
Est, Glom Filt Rate: >90 mL/min/{1.73_m2} (ref >60)
Glucose: 183 mg/dL — ABNORMAL HIGH (ref 70–99)
Potassium: 4.5 mmol/L (ref 3.5–5.1)
Sodium: 138 mmol/L (ref 136–145)

## 2022-11-17 MED ORDER — DSS 100 MG PO CAPS
100 | ORAL_CAPSULE | Freq: Two times a day (BID) | ORAL | 0 refills | Status: AC
Start: 2022-11-17 — End: ?

## 2022-11-17 MED ORDER — DSS 100 MG PO CAPS
100 | ORAL_CAPSULE | Freq: Two times a day (BID) | ORAL | 0 refills | Status: DC
Start: 2022-11-17 — End: 2022-11-17

## 2022-11-17 MED ORDER — CIPROFLOXACIN HCL 500 MG PO TABS
500 | ORAL_TABLET | Freq: Two times a day (BID) | ORAL | 0 refills | Status: DC
Start: 2022-11-17 — End: 2022-11-17

## 2022-11-17 MED ORDER — CIPROFLOXACIN HCL 500 MG PO TABS
500 | ORAL_TABLET | Freq: Two times a day (BID) | ORAL | 0 refills | Status: AC
Start: 2022-11-17 — End: 2022-11-24

## 2022-11-17 MED ORDER — HYDROCODONE-ACETAMINOPHEN 5-325 MG PO TABS
5-325 | ORAL_TABLET | ORAL | 0 refills | Status: DC | PRN
Start: 2022-11-17 — End: 2022-11-17

## 2022-11-17 MED ORDER — HYDROCODONE-ACETAMINOPHEN 5-325 MG PO TABS
5-325 | ORAL_TABLET | ORAL | 0 refills | Status: AC | PRN
Start: 2022-11-17 — End: 2022-11-20

## 2022-11-17 MED ADMIN — lamoTRIgine (LAMICTAL) tablet 75 mg: 75 mg | ORAL | @ 01:00:00 | NDC 00904700761

## 2022-11-17 MED ADMIN — carvedilol (COREG) tablet 3.125 mg: 3.125 mg | ORAL | @ 14:00:00 | NDC 68382009201

## 2022-11-17 MED ADMIN — docusate sodium (COLACE) capsule 100 mg: 100 mg | ORAL | @ 14:00:00 | NDC 60687012911

## 2022-11-17 MED ADMIN — 0.9 % sodium chloride infusion: INTRAVENOUS | @ 05:00:00 | NDC 00338004904

## 2022-11-17 MED ADMIN — busPIRone (BUSPAR) tablet 10 mg: 10 mg | ORAL | @ 19:00:00 | NDC 00904712161

## 2022-11-17 MED ADMIN — sertraline (ZOLOFT) tablet 150 mg: 150 mg | ORAL | @ 14:00:00 | NDC 60687024211

## 2022-11-17 MED ADMIN — metFORMIN (GLUCOPHAGE) tablet 500 mg: 500 mg | ORAL | @ 14:00:00 | NDC 00904716261

## 2022-11-17 MED ADMIN — lurasidone (LATUDA) tablet 60 mg (Patient Supplied): 60 mg | ORAL | @ 01:00:00 | NDC 00904735761

## 2022-11-17 MED ADMIN — ceFAZolin (ANCEF) 3000 mg in sterile water 30 mL IV syringe: 3000 mg | INTRAVENOUS | @ 06:00:00 | NDC 60505614300

## 2022-11-17 MED ADMIN — acetaminophen (TYLENOL) tablet 650 mg: 650 mg | ORAL | @ 07:00:00 | NDC 00904677361

## 2022-11-17 MED ADMIN — busPIRone (BUSPAR) tablet 10 mg: 10 mg | ORAL | @ 14:00:00 | NDC 00904712161

## 2022-11-17 MED ADMIN — ceFAZolin (ANCEF) 3000 mg in sterile water 30 mL IV syringe: 3000 mg | INTRAVENOUS | @ 15:00:00 | NDC 63323018509

## 2022-11-17 MED FILL — BUSPIRONE HCL 10 MG PO TABS: 10 MG | ORAL | Qty: 1

## 2022-11-17 MED FILL — METFORMIN HCL 500 MG PO TABS: 500 MG | ORAL | Qty: 1

## 2022-11-17 MED FILL — DOCUSATE SODIUM 100 MG PO CAPS: 100 MG | ORAL | Qty: 1

## 2022-11-17 MED FILL — CARVEDILOL 3.125 MG PO TABS: 3.125 MG | ORAL | Qty: 1

## 2022-11-17 MED FILL — ACETAMINOPHEN 325 MG PO TABS: 325 MG | ORAL | Qty: 2

## 2022-11-17 MED FILL — CEFAZOLIN SODIUM 10 G IJ SOLR: 10 g | INTRAMUSCULAR | Qty: 3000

## 2022-11-17 MED FILL — SERTRALINE HCL 50 MG PO TABS: 50 MG | ORAL | Qty: 1

## 2022-11-17 NOTE — Progress Notes (Signed)
 Admit Date: 11/16/2022      Subjective:     Javier James is POD 1 Procedure(s):  PROSTATECTOMY LAPAROSCOPIC ROBOTIC,POSSIBLE BILATERAL LYMPH NODE DISSECTION TO THE PROSTATE    No new complaints. Slept well. Ambulating. Reports flatus.    Objective:     Patient Vitals for the past 8 hrs:   BP Temp Temp src Pulse Resp SpO2   11/17/22 0717 124/78 98.2 F (36.8 C) Oral 79 18 --   11/17/22 0530 122/86 97.5 F (36.4 C) Oral 76 22 96 %     09/10 0701 - 09/10 1900  In: 120 [P.O.:120]  Out: 2000 [Urine:2000]  09/08 1901 - 09/10 0700  In: 2772.5 [I.V.:2772.5]  Out: 1850 [Urine:1600; Drains:50]    Physical Exam:  GENERAL ASSESSMENT: alert, oriented to person, place and time, no acute distress and no anxiety, depression or agitation  Chest: normal work of breathing  CVS exam: normal rate, regular rhythm, normal S1, S2, no murmurs, rubs, clicks or gallops.  ABDOMEN: not done  Neurological exam reveals alert, oriented, normal speech, no focal findings or movement disorder noted.  MALE GENITOURINARY EXAM: not done  MALE GENITAL EXAM: not done    Data Review   Recent Results (from the past 24 hour(s))   Hemoglobin and Hematocrit    Collection Time: 11/16/22  1:13 PM   Result Value Ref Range    Hemoglobin 15.1 13.6 - 17.2 g/dL    Hematocrit 52.7 58.8 - 50.3 %   POCT Glucose    Collection Time: 11/16/22  3:12 PM   Result Value Ref Range    POC Glucose 255 (H) 65 - 100 mg/dL    Performed by: Burlene    POCT Glucose    Collection Time: 11/16/22  4:40 PM   Result Value Ref Range    POC Glucose 236 (H) 65 - 100 mg/dL    Performed by: PruittLindaV    POCT Glucose    Collection Time: 11/16/22  7:06 PM   Result Value Ref Range    POC Glucose 216 (H) 65 - 100 mg/dL    Performed by: MayerLavoniaPCT    Basic Metabolic Panel    Collection Time: 11/17/22  5:10 AM   Result Value Ref Range    Sodium 138 136 - 145 mmol/L    Potassium 4.5 3.5 - 5.1 mmol/L    Chloride 103 98 - 107 mmol/L    CO2 26 20 - 28 mmol/L    Anion Gap 10 9  - 18 mmol/L    Glucose 183 (H) 70 - 99 mg/dL    BUN 16 6 - 23 MG/DL    Creatinine 9.07 9.19 - 1.30 MG/DL    Est, Glom Filt Rate >90 >60 ml/min/1.48m2    Calcium 8.5 (L) 8.8 - 10.2 MG/DL   Hemoglobin and Hematocrit    Collection Time: 11/17/22  5:10 AM   Result Value Ref Range    Hemoglobin 14.0 13.6 - 17.2 g/dL    Hematocrit 56.9 58.8 - 50.3 %   POCT Glucose    Collection Time: 11/17/22  7:20 AM   Result Value Ref Range    POC Glucose 159 (H) 65 - 100 mg/dL    Performed by: BallKristenBSN    POCT Glucose    Collection Time: 11/17/22 11:04 AM   Result Value Ref Range    POC Glucose 189 (H) 65 - 100 mg/dL    Performed by: BallKristenBSN        Assessment:  Principal Problem:    Prostate cancer (HCC)  Resolved Problems:    * No resolved hospital problems. *    Foley draining yellow UOP. JP 50 cc overnight. VSS. HGB 14.0.    Pre-Op Diagnosis: Prostate cancer (HCC) [C61]    Post-Op Diagnosis:  * No post-op diagnosis entered *    Procedures: Procedure(s):  S/P PROSTATECTOMY LAPAROSCOPIC ROBOTIC,POSSIBLE BILATERAL LYMPH NODE DISSECTION TO THE PROSTATE      Plan:   Keep foley at D/C  Teach foley care.  IS hourly.  Remove JP.  Ambulating.  Reg. Diet.      Signed By: Heron GORMAN Core, NP-C     November 17, 2022      [facility]    I have reviewed the above note and examined the patient.  I agree with the exam, assessment and plan.   Advance diet.  Pull drain.  Home later with Foley if progressing.    Jayson SQUIBB Fontella Shan, DO

## 2022-11-17 NOTE — Discharge Summary (Signed)
 Discharge Summary    Date: 11/17/2022  Patient Name: Javier James    Date of Birth: [birth year]     Age: 57 y.o.    Admit Date: 11/16/2022  Discharge Date: 11/17/2022  Discharge Condition: Stable    Admission Diagnosis  Prostate cancer [facility]) [C61]      Discharge Diagnosis  Principal Problem:    Prostate cancer [facility])  Resolved Problems:    * No resolved hospital problems. [facility] Stay  Narrative of Hospital Course:  57 y.o., male returns in follow up for prostate cancer. Fusion biopsies completed by Dr. Cherylann on 09/08/22. Prebx PS was 6.87 and vol was 35g. Path showed Gleason 7 & 8 in all R sided cores. MRI on 07/20/22 showed a pirads 5 RM/RA lesion with questionable ECE and bilateral TZ lesions. PSMA on 09/24/22 shows R sided prostate update without mets. Prior appy. Reports min LUTS on Flomax  and reports success with Cialis  for ED. 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      11/16/22 underwent Robotic-assisted laparoscopic radical prostatectomy and bilateral pelvic lymph node with Dr. Debbora.      Consultants:  None    Surgeries/procedures Performed:   Robotic-assisted laparoscopic radical prostatectomy and bilateral pelvic lymph node.     Treatments:    Analgesia, IV Hydration and Surgery    Acetaminophen  w/ Codeine    Discharge Plan/Disposition:  Home    Hospital/Incidental Findings Requiring Follow Up:    Patient Instructions:    Diet: Regular Diet    Activity:No Lifting, Driving or Strenuous Excercise, No Heavy Lifting and No Driving for 2 Weeks  For number of days (if applicable):      Other Instructions:    Provider Follow-Up:   No follow-ups on file.     Significant Diagnostic Studies:    Recent Labs:  Admission on 11/16/2022  Crossmatch expiration date                    Date: 11/16/2022  Value: 11/19/2022,2359                       Status: Final  ABO/Rh                                        Date: 11/16/2022  Value: AB POSITIVE   Status:  Final  Antibody Screen                               Date: 11/16/2022  Value: NEG           Status: Final  POC Glucose                                   Date: 11/16/2022  Value: 202 (H)     Ref range: 65 - 100 mg/dL     Status: Final                Comment: 47 - 60 mg/dl Consistent with, but not fully diagnostic of hypoglycemia.  101 - 125 mg/dl Impaired fasting glucose/consistent with pre-diabetes mellitus  > 126 mg/dl Fasting glucose consistent with overt diabetes mellitus    Performed by:  Date: 11/16/2022  Value: OwenElizabethRN                       Status: Final  Hemoglobin                                    Date: 11/16/2022  Value: 15.1        Ref range: 13.6 - 17.2 g/dL   Status: Final  Hematocrit                                    Date: 11/16/2022  Value: 47.2        Ref range: 41.1 - 50.3 %      Status: Final  POC Glucose                                   Date: 11/16/2022  Value: 255 (H)     Ref range: 65 - 100 mg/dL     Status: Final                Comment: 47 - 60 mg/dl Consistent with, but not fully diagnostic of hypoglycemia.  101 - 125 mg/dl Impaired fasting glucose/consistent with pre-diabetes mellitus  > 126 mg/dl Fasting glucose consistent with overt diabetes mellitus    Performed by:                                 Date: 11/16/2022  Value: VevonBaileyBSN                       Status: Final  POC Glucose                                   Date: 11/16/2022  Value: 236 (H)     Ref range: 65 - 100 mg/dL     Status: Final                Comment: 47 - 60 mg/dl Consistent with, but not fully diagnostic of hypoglycemia.  101 - 125 mg/dl Impaired fasting glucose/consistent with pre-diabetes mellitus  > 126 mg/dl Fasting glucose consistent with overt diabetes mellitus    Performed by:                                 Date: 11/16/2022  Value: PruittLindaV                       Status: Final  Sodium                                        Date: 11/17/2022  Value: 138         Ref  range: 136 - 145 mmol/L   Status: Final  Potassium  Date: 11/17/2022  Value: 4.5         Ref range: 3.5 - 5.1 mmol/L   Status: Final                Comment: Specimen hemolysis has exceeded the interference as defined by Roche. Value may be falsely increased.  Chloride                                      Date: 11/17/2022  Value: 103         Ref range: 98 - 107 mmol/L    Status: Final  CO2                                           Date: 11/17/2022  Value: 26          Ref range: 20 - 28 mmol/L     Status: Final  Anion Gap                                     Date: 11/17/2022  Value: 10          Ref range: 9 - 18 mmol/L      Status: Final  Glucose                                       Date: 11/17/2022  Value: 183 (H)     Ref range: 70 - 99 mg/dL      Status: Final                Comment: <70 mg/dL Consistent with, but not fully diagnostic of hypoglycemia.  100 - 125 mg/dL Impaired fasting glucose/consistent with pre-diabetes mellitus.  > 126 mg/dl Fasting glucose consistent with overt diabetes mellitus    BUN                                           Date: 11/17/2022  Value: 16          Ref range: 6 - 23 MG/DL       Status: Final  Creatinine                                    Date: 11/17/2022  Value: 0.92        Ref range: 0.80 - 1.30 MG/DL  Status: Final  Est, Glom Filt Rate                           Date: 11/17/2022  Value: >90         Ref range: >60 ml/min/1.62m2  Status: Final                Comment:    Pediatric calculator link: https://www.kidney.org/professionals/kdoqi/gfr_calculatorped     These results are not intended for use in patients <71 years of age.     eGFR results are calculated without a race factor using  the 2021 CKD-EPI equation. Careful clinical correlation is recommended, particularly when comparing to results calculated using previous equations.  The CKD-EPI equation is less accurate in patients with extremes of muscle mass, extra-renal metabolism of  creatinine, excessive creatine ingestion, or following therapy that affects renal tubular secretion.    Calcium                                       Date: 11/17/2022  Value: 8.5 (L)     Ref range: 8.8 - 10.2 MG/DL   Status: Final  Hemoglobin                                    Date: 11/17/2022  Value: 14.0        Ref range: 13.6 - 17.2 g/dL   Status: Final  Hematocrit                                    Date: 11/17/2022  Value: 43.0        Ref range: 41.1 - 50.3 %      Status: Final  POC Glucose                                   Date: 11/16/2022  Value: 216 (H)     Ref range: 65 - 100 mg/dL     Status: Final                Comment: 47 - 60 mg/dl Consistent with, but not fully diagnostic of hypoglycemia.  101 - 125 mg/dl Impaired fasting glucose/consistent with pre-diabetes mellitus  > 126 mg/dl Fasting glucose consistent with overt diabetes mellitus    Performed by:                                 Date: 11/16/2022  Value: MayerLavoniaPCT                       Status: Final  POC Glucose                                   Date: 11/17/2022  Value: 159 (H)     Ref range: 65 - 100 mg/dL     Status: Final                Comment: 47 - 60 mg/dl Consistent with, but not fully diagnostic of hypoglycemia.  101 - 125 mg/dl Impaired fasting glucose/consistent with pre-diabetes mellitus  > 126 mg/dl Fasting glucose consistent with overt diabetes mellitus    Performed by:                                 Date: 11/17/2022  Value: BallKristenBSN                       Status: Final  POC Glucose  Date: 11/17/2022  Value: 189 (H)     Ref range: 65 - 100 mg/dL     Status: Final                Comment: 47 - 60 mg/dl Consistent with, but not fully diagnostic of hypoglycemia.  101 - 125 mg/dl Impaired fasting glucose/consistent with pre-diabetes mellitus  > 126 mg/dl Fasting glucose consistent with overt diabetes mellitus    Performed by:                                 Date: 11/17/2022  Value:  BallKristenBSN                       Status: Final  ------------    Radiology last 7 days:  No results found.     @DCPENDLAB @    Discharge Medications    Current Discharge Medication List    START taking these medications    HYDROcodone -acetaminophen  (NORCO ) 5-325 MG per tablet  Take 1 tablet by mouth every 4 hours as needed for Pain for up to 3 days. Max Daily Amount: 6 tablets  Qty: 12 tablet Refills: 0  Comments: Reduce doses taken as pain becomes manageable  Associated Diagnoses:Prostate cancer (HCC)    ciprofloxacin  (CIPRO ) 500 MG tablet  Take 1 tablet by mouth 2 times daily for 7 days  Qty: 14 tablet Refills: 0    docusate sodium  (COLACE, DULCOLAX) 100 MG CAPS  Take 100 mg by mouth 2 times daily  Qty: 60 capsule Refills: 0          Current Discharge Medication List        Current Discharge Medication List    CONTINUE these medications which have NOT CHANGED    lamoTRIgine  (LAMICTAL ) 25 MG tablet  Take 3 tablets by mouth at bedtime    sertraline  (ZOLOFT ) 50 MG tablet  Take 3 tablets by mouth daily    lurasidone  (LATUDA ) 60 MG TABS tablet  Take 1 tablet by mouth at bedtime    traZODone  (DESYREL ) 50 MG tablet  Take 3 tablets by mouth nightly    hydrOXYzine  HCl (ATARAX ) 50 MG tablet  Take 1 tablet by mouth 3 times daily as needed for Itching    metFORMIN  (GLUCOPHAGE ) 500 MG tablet  Take 1 tablet by mouth 2 times daily (with meals)    carvedilol  (COREG ) 3.125 MG tablet  Take 1 tablet by mouth 2 times daily (with meals)    lithium  300 MG capsule  Take 2 capsules by mouth at bedtime    busPIRone  (BUSPAR ) 10 MG tablet  Take 1 tablet by mouth 3 times daily    clonazePAM  (KLONOPIN ) 0.5 MG tablet  Take 1 tablet by mouth 2 times daily as needed.    tamsulosin  (FLOMAX ) 0.4 MG capsule  Take 1 capsule by mouth nightly  Qty: 30 capsule Refills: 5  Associated Diagnoses:Benign prostatic hyperplasia with urinary frequency          Current Discharge Medication List        Time Spent on Discharge:  minutes were spent in patient  examination, evaluation, counseling as well as medication reconciliation, prescriptions for required medications, discharge plan, and follow up.    Electronically signed by Heron GORMAN Core, NP-C on 11/17/22 at 2:14 PM EDT

## 2022-11-17 NOTE — Progress Notes (Signed)
 Pt discharged home. Completed hourly rounds. Pt's wife took him home. Discharge paperwork and instructions handed to pt and reviewed with pt. Pt's I.V removed without complications. Pt denies needs at this time. Teaching regarding foley care provided. New leg bag and night bag provided to pt. Opportunity for questions provided. Pt verbalized understanding. All needs met at this time.

## 2022-11-17 NOTE — Progress Notes (Addendum)
 Hourly rounds performed this shift. Bed lowered and locked. Call light within reach. PRN pain medication given per MAR. Pt passing flatus. JP drain compressed to suction. Patient supplied medication in omnicell. Pt ambulated halls this shift.

## 2022-11-17 NOTE — Plan of Care (Signed)
 Problem: Chronic Conditions and Co-morbidities  Goal: Patient's chronic conditions and co-morbidity symptoms are monitored and maintained or improved  11/17/2022 0327 by Ishmael Ligas, RN  Outcome: Progressing  11/16/2022 1720 by Alm Bicker, RN  Outcome: Progressing     Problem: Safety - Adult  Goal: Free from fall injury  11/17/2022 0327 by Ishmael Ligas, RN  Outcome: Progressing  11/16/2022 1720 by Alm Bicker, RN  Outcome: Progressing     Problem: Pain  Goal: Verbalizes/displays adequate comfort level or baseline comfort level  11/17/2022 0327 by Ishmael Ligas, RN  Outcome: Progressing  11/16/2022 1720 by Alm Bicker, RN  Outcome: Progressing     Problem: Discharge Planning  Goal: Discharge to home or other facility with appropriate resources  11/17/2022 0327 by Ishmael Ligas, RN  Outcome: Progressing  11/16/2022 1720 by Alm Bicker, RN  Outcome: Progressing  Flowsheets (Taken 11/16/2022 1655)  Discharge to home or other facility with appropriate resources:   Identify barriers to discharge with patient and caregiver   Arrange for needed discharge resources and transportation as appropriate     Problem: ABCDS Injury Assessment  Goal: Absence of physical injury  11/17/2022 0327 by Ishmael Ligas, RN  Outcome: Progressing  11/16/2022 1720 by Alm Bicker, RN  Outcome: Progressing

## 2022-11-23 ENCOUNTER — Ambulatory Visit: Admit: 2022-11-23 | Discharge: 2022-11-23 | Payer: TRICARE (CHAMPUS) | Attending: Urology | Primary: Family Medicine

## 2022-11-23 ENCOUNTER — Encounter: Payer: PRIVATE HEALTH INSURANCE | Attending: Urology | Primary: Family Medicine

## 2022-11-23 DIAGNOSIS — C61 Malignant neoplasm of prostate: Secondary | ICD-10-CM

## 2022-11-23 MED ORDER — TADALAFIL 20 MG PO TABS
20 | ORAL_TABLET | Freq: Every day | ORAL | 5 refills | Status: DC | PRN
Start: 2022-11-23 — End: 2023-05-31

## 2022-11-23 NOTE — Progress Notes (Signed)
 [facility]  [address]  [place], [place] [address]  [phone]    Javier James  DOB: October 15, 1965     HPI   57 y.o., male returns in follow up for CaP.  S/P RALP on 11/16/22.  Path showed Gleason 4+3, T2N0 with a R inf positive margin.  He has done well post op.  Taking Cialis  prior.      Past Medical History:   Diagnosis Date    CAD (coronary artery disease)     10% LAD    Elevated PSA     Hypertension     Prostate cancer (HCC)     PTSD (post-traumatic stress disorder)     Sleep apnea     uses cpap hs    Type 2 diabetes mellitus (HCC)     oral reliant; AVG BS 80-100; denies hypo; last A1c     Past Surgical History:   Procedure Laterality Date    APPENDECTOMY      BLADDER SURGERY  2016    Lesion removed    CARDIAC PROCEDURE N/A 02/12/2022    Left heart cath / coronary angiography performed by Sterling Rome Ned, MD at [facility] CARDIAC CATH LAB    KNEE SURGERY Right     PROSTATE BIOPSY N/A 09/08/2022    MRI FUSION PROSTATE BIOPSY performed by Cherylann Mungo, MD at [facility] MAIN OR    PROSTATECTOMY Bilateral 11/16/2022    PROSTATECTOMY LAPAROSCOPIC ROBOTIC,POSSIBLE BILATERAL LYMPH NODE DISSECTION TO THE PROSTATE performed by Debbora Jayson SQUIBB, DO at [facility] MAIN OR    VASECTOMY  1994     Current Outpatient Medications   Medication Sig Dispense Refill    tadalafil  (CIALIS ) 20 MG tablet Take 1 tablet by mouth daily as needed for Erectile Dysfunction 20 tablet 5    ciprofloxacin  (CIPRO ) 500 MG tablet Take 1 tablet by mouth 2 times daily for 7 days 14 tablet 0    docusate (COLACE, DULCOLAX) 100 MG CAPS Take 100 mg by mouth 2 times daily 60 capsule 0    lamoTRIgine  (LAMICTAL ) 25 MG tablet Take 3 tablets by mouth at bedtime      sertraline  (ZOLOFT ) 50 MG tablet Take 3 tablets by mouth daily      lurasidone  (LATUDA ) 60 MG TABS tablet Take 1 tablet by mouth at bedtime      traZODone  (DESYREL ) 50 MG tablet Take 3 tablets by mouth nightly      hydrOXYzine  HCl (ATARAX ) 50 MG tablet Take 1 tablet by mouth 3 times  daily as needed for Itching      metFORMIN  (GLUCOPHAGE ) 500 MG tablet Take 1 tablet by mouth 2 times daily (with meals)      carvedilol  (COREG ) 3.125 MG tablet Take 1 tablet by mouth 2 times daily (with meals)      tamsulosin  (FLOMAX ) 0.4 MG capsule Take 1 capsule by mouth nightly 30 capsule 5    lithium  300 MG capsule Take 2 capsules by mouth at bedtime      busPIRone  (BUSPAR ) 10 MG tablet Take 1 tablet by mouth 3 times daily      clonazePAM  (KLONOPIN ) 0.5 MG tablet Take 1 tablet by mouth 2 times daily as needed.      HYDROcodone -acetaminophen  (NORCO ) 5-325 MG per tablet Take 1 tablet by mouth every 4 hours as needed for Pain for up to 3 days. Max Daily Amount: 6 tablets 12 tablet 0     No current facility-administered medications for this visit.  Allergies   Allergen Reactions    Penicillins Anaphylaxis     Other Reaction(s): Pharyngeal spasm, Unknown    Lisinopril      Other Reaction(s): Cough    Topiramate Other (See Comments)     When I take it, I'm up for 3 or 4 days     Social History     Socioeconomic History    Marital status: Married     Spouse name: Not on file    Number of children: Not on file    Years of education: Not on file    Highest education level: Not on file   Occupational History    Not on file   Tobacco Use    Smoking status: Former     Current packs/day: 0.00     Average packs/day: 1 pack/day for 14.0 years (14.0 ttl pk-yrs)     Types: Cigarettes     Start date: 03/09/1994     Quit date: 2010     Years since quitting: 14.7     Passive exposure: Past    Smokeless tobacco: Never   Vaping Use    Vaping status: Never Used   Substance and Sexual Activity    Alcohol use: Not Currently    Drug use: Never    Sexual activity: Yes     Partners: Female   Other Topics Concern    Not on file   Social History Narrative    Not on file     Social Determinants of Health     Financial Resource Strain: Low Risk  (06/03/2022)    Received from [facility] [employer] [facility]    Overall Financial Resource Strain (CARDIA)     Difficulty of Paying Living Expenses: Not very hard   Food Insecurity: No Food Insecurity (11/16/2022)    Hunger Vital Sign     Worried About Running Out of Food in the Last Year: Never true     Ran Out of Food in the Last Year: Never true   Transportation Needs: No Transportation Needs (11/16/2022)    PRAPARE - Therapist, Art (Medical): No     Lack of Transportation (Non-Medical): No   Physical Activity: Insufficiently Active (06/03/2022)    Received from [facility], [facility]    Exercise Vital Sign     Days of Exercise per Week: 3 days     Minutes of Exercise per Session: 40 min   Stress: Stress Concern Present (06/03/2022)    Received from [facility], [facility]    [facility] of Occupational Health - Occupational Stress Questionnaire     Feeling of Stress : Rather much   Social Connections: Unknown (07/14/2022)    Received from [facility], [facility]    Social Connections     Frequency of Communication with Friends and Family: Not asked     Frequency of Social Gatherings with Friends and Family: Not asked   Intimate Partner Violence: Unknown (07/14/2022)    Received from [facility], [facility]    Intimate Partner Violence     Fear of Current or Ex-Partner: Not asked     Emotionally Abused: Not asked     Physically Abused: Not asked     Sexually Abused: Not asked   Housing Stability: Low Risk  (11/16/2022)    Housing Stability Vital Sign     Unable to Pay for Housing  in the Last Year: No     Number of Times Moved in the Last Year: 0     Homeless in the Last Year: No     Family History   Problem Relation Age of Onset    Heart Disease Father        Review of Systems  All systems reviewed and are negative at this time.    Physical Exam  There were no vitals taken for this visit.  General appearance -  alert, well appearing, and in no distress  Mental status - alert, oriented to person, place, and time  Eyes - extraocular eye movements intact, sclera anicteric  Abdomen - soft, nontender, nondistended, no masses or organomegaly.  Ports healing well.  Neurological -  normal speech, no focal findings or movement disorder noted  Skin - normal coloration and turgor        Assessment/Plan    ICD-10-CM    1. Prostate cancer (HCC)  C61 PSA, ultrasensitive        Path reviewed.  Foley removed.  Pt will cont Cialis  biwkly for rehab.  RTO in 3 mo with PSA prior.    Cambell Stanek P Fabiola Mudgett, DO

## 2022-11-27 ENCOUNTER — Encounter

## 2022-12-10 ENCOUNTER — Encounter

## 2022-12-11 ENCOUNTER — Encounter

## 2022-12-13 LAB — CULTURE, URINE: Culture: <10000

## 2023-02-15 ENCOUNTER — Encounter: Admit: 2023-02-15 | Payer: PRIVATE HEALTH INSURANCE | Primary: Family Medicine

## 2023-02-15 DIAGNOSIS — C61 Malignant neoplasm of prostate: Secondary | ICD-10-CM

## 2023-02-22 ENCOUNTER — Ambulatory Visit
Admit: 2023-02-22 | Discharge: 2023-02-22 | Payer: PRIVATE HEALTH INSURANCE | Attending: Urology | Admitting: Urology | Primary: Family Medicine

## 2023-02-22 DIAGNOSIS — C61 Malignant neoplasm of prostate: Principal | ICD-10-CM

## 2023-02-22 LAB — AMB POC URINALYSIS DIP STICK AUTO W/O MICRO
Bilirubin, Urine, POC: NEGATIVE
Blood (UA POC): NEGATIVE
Glucose, Urine, POC: 1000 mg/dL
KETONES, Urine, POC: NEGATIVE mg/dL
Leukocyte Esterase, Urine, POC: NEGATIVE
Nitrite, Urine, POC: NEGATIVE
Protein, Urine, POC: NEGATIVE mg/dL
Specific Gravity, Urine, POC: 1.02 (ref 1.001–1.035)
Urobilinogen, POC: 0.2 mg/dL (ref <1.1)
pH, Urine, POC: 6 (ref 4.6–8.0)

## 2023-02-22 NOTE — Progress Notes (Signed)
 [facility]  [address]  [place], [place] [address]  [phone]    Javier James  DOB: Jun 06, 1965     HPI   57 y.o., male returns in follow up for CaP. S/P RALP on 11/16/22. Path showed Gleason 4+3, T2N0 with a R inf positive margi

## 2023-05-18 ENCOUNTER — Encounter

## 2023-05-18 ENCOUNTER — Encounter: Payer: TRICARE (CHAMPUS) | Primary: Family Medicine

## 2023-05-20 LAB — PSA, ULTRASENSITIVE: PSA, Ultrasensitive: 0.05 ng/mL (ref 0.000–4.000)

## 2023-05-31 ENCOUNTER — Ambulatory Visit: Admit: 2023-05-31 | Discharge: 2023-05-31 | Payer: TRICARE (CHAMPUS) | Attending: Urology | Primary: Family Medicine

## 2023-05-31 DIAGNOSIS — C61 Malignant neoplasm of prostate: Secondary | ICD-10-CM

## 2023-05-31 LAB — AMB POC URINALYSIS DIP STICK AUTO W/O MICRO
Bilirubin, Urine, POC: NEGATIVE
Blood (UA POC): NEGATIVE
Glucose, Urine, POC: NEGATIVE mg/dL
Leukocyte Esterase, Urine, POC: NEGATIVE
Nitrite, Urine, POC: NEGATIVE
Protein, Urine, POC: NEGATIVE mg/dL
Specific Gravity, Urine, POC: 1.02 (ref 1.001–1.035)
Urobilinogen, POC: 0.2 mg/dL (ref <1.1)
pH, Urine, POC: 7 (ref 4.6–8.0)

## 2023-05-31 MED ORDER — TADALAFIL 20 MG PO TABS
20 | ORAL_TABLET | Freq: Every day | ORAL | 5 refills | Status: DC | PRN
Start: 2023-05-31 — End: 2023-09-06

## 2023-05-31 NOTE — Progress Notes (Signed)
 [facility]  [address]  [place], [place] [address]  [phone]    Javier James  DOB: 04/06/1965     HPI   58 y.o., male returns in follow up for CaP. S/P RALP on 11/16/22. Path showed Gleason 4+3, T2N0 with a R inf positive margin. He has done well post op. Taking Cialis prior.  Reports improving erections on Cialis rehab.  Cont to report 2-3ppd SUI.  Unable to reach [facility] PT.  PSA was 0.016 on 02/15/23 and is now 0.05 on 05/18/23.  RN at [employer].       Past Medical History:   Diagnosis Date    CAD (coronary artery disease)     10% LAD    Elevated PSA     Hypertension     Prostate cancer (HCC)     PTSD (post-traumatic stress disorder)     Sleep apnea     uses cpap hs    Type 2 diabetes mellitus (HCC)     oral reliant; AVG BS 80-100; denies hypo; last A1c     Past Surgical History:   Procedure Laterality Date    APPENDECTOMY      BLADDER SURGERY  2016    Lesion removed    CARDIAC PROCEDURE N/A 02/12/2022    Left heart cath / coronary angiography performed by Lurlean Nanny, MD at [facility] CARDIAC CATH LAB    KNEE SURGERY Right     PROSTATE BIOPSY N/A 09/08/2022    MRI FUSION PROSTATE BIOPSY performed by Caryl Comes, MD at [facility] MAIN OR    PROSTATECTOMY Bilateral 11/16/2022    PROSTATECTOMY LAPAROSCOPIC ROBOTIC,POSSIBLE BILATERAL LYMPH NODE DISSECTION TO THE PROSTATE performed by Pryor Montes, DO at [facility] MAIN OR    VASECTOMY  1994     Current Outpatient Medications   Medication Sig Dispense Refill    tadalafil (CIALIS) 20 MG tablet Take 1 tablet by mouth daily as needed for Erectile Dysfunction 20 tablet 5    lamoTRIgine (LAMICTAL) 25 MG tablet Take 3 tablets by mouth at bedtime      sertraline (ZOLOFT) 50 MG tablet Take 3 tablets by mouth daily      lurasidone (LATUDA) 60 MG TABS tablet Take 1 tablet by mouth at bedtime      traZODone (DESYREL) 50 MG tablet Take 3 tablets by mouth nightly      hydrOXYzine HCl (ATARAX) 50 MG tablet Take 1 tablet by mouth 3 times daily as needed for Itching       carvedilol (COREG) 3.125 MG tablet Take 1 tablet by mouth 2 times daily (with meals)      lithium 300 MG capsule Take 2 capsules by mouth at bedtime      busPIRone (BUSPAR) 10 MG tablet Take 1 tablet by mouth 3 times daily      clonazePAM (KLONOPIN) 0.5 MG tablet Take 1 tablet by mouth 2 times daily as needed.      ciprofloxacin (CIPRO) 500 MG tablet Take 1 tablet by mouth 2 times daily for 7 days 14 tablet 0    HYDROcodone-acetaminophen (NORCO) 5-325 MG per tablet Take 1 tablet by mouth every 4 hours as needed for Pain for up to 3 days. Max Daily Amount: 6 tablets 12 tablet 0    docusate (COLACE, DULCOLAX) 100 MG CAPS Take 100 mg by mouth 2 times daily 60 capsule 0    metFORMIN (GLUCOPHAGE) 500 MG tablet Take 1 tablet by mouth 2 times daily (with meals)  tamsulosin (FLOMAX) 0.4 MG capsule Take 1 capsule by mouth nightly 30 capsule 5     No current facility-administered medications for this visit.     Allergies   Allergen Reactions    Penicillins Anaphylaxis     Other Reaction(s): Pharyngeal spasm, Unknown    Lisinopril      Other Reaction(s): Cough    Topiramate Other (See Comments)     "When I take it, I'm up for 3 or 4 days"     Social History     Socioeconomic History    Marital status: Married     Spouse name: Not on file    Number of children: Not on file    Years of education: Not on file    Highest education level: Not on file   Occupational History    Not on file   Tobacco Use    Smoking status: Former     Current packs/day: 0.00     Average packs/day: 1 pack/day for 14.0 years (14.0 ttl pk-yrs)     Types: Cigarettes     Start date: 03/09/1994     Quit date: 2010     Years since quitting: 15.2     Passive exposure: Past    Smokeless tobacco: Never   Vaping Use    Vaping status: Never Used   Substance and Sexual Activity    Alcohol use: Not Currently    Drug use: Never    Sexual activity: Yes     Partners: Female   Other Topics Concern    Not on file   Social History Narrative    Not on file     Social  Drivers of Health     Financial Resource Strain: Low Risk  (01/07/2023)    Received from [employer] [facility]    Overall Financial Resource Strain (CARDIA)     Difficulty of Paying Living Expenses: Not hard at all   Food Insecurity: No Food Insecurity (01/07/2023)    Received from [facility]    Hunger Vital Sign     Worried About Running Out of Food in the Last Year: Never true     Ran Out of Food in the Last Year: Never true   Transportation Needs: No Transportation Needs (01/07/2023)    Received from [employer] [facility]    PRAPARE - Transportation     Lack of Transportation (Medical): No     Lack of Transportation (Non-Medical): No   Physical Activity: Sufficiently Active (01/07/2023)    Received from [facility]    Exercise Vital Sign     Days of Exercise per Week: 4 days     Minutes of Exercise per Session: 150+ min   Stress: No Stress Concern Present (01/07/2023)    Received from [facility] of Occupational Health - Occupational Stress Questionnaire     Feeling of Stress : Not at all   Social Connections: Moderately Integrated (01/07/2023)    Received from [facility]    Social Connection and Isolation Panel [NHANES]     Frequency of Communication with Friends and Family: More than three times a week     Frequency of Social Gatherings with Friends and Family: Twice a week     Attends Religious Services: More than 4 times per year     Active Member of [employer] or Organizations: No     Attends Banker Meetings:  Never     Marital Status: Married   Catering manager Violence: Not At Risk (01/07/2023)    Received from [facility]    Humiliation, Afraid, Rape, and Kick questionnaire     Fear of Current or Ex-Partner: No     Emotionally Abused: No     Physically Abused: No     Sexually Abused: No   Housing Stability:  Low Risk  (11/16/2022)    Housing Stability Vital Sign     Unable to Pay for Housing in the Last Year: No     Number of Times Moved in the Last Year: 0     Homeless in the Last Year: No     Family History   Problem Relation Age of Onset    Heart Disease Father        Review of Systems  All systems reviewed and are negative at this time.    Physical Exam  There were no vitals taken for this visit.  General appearance - alert, well appearing, and in no distress  Mental status - alert, oriented to person, place, and time  Eyes - extraocular eye movements intact, sclera anicteric  Abdomen - soft, nontender, nondistended, no masses or organomegaly  Neurological -  normal speech, no focal findings or movement disorder noted  Skin - normal coloration and turgor      Urinalysis  UA - Dipstick  Results for orders placed or performed in visit on 05/31/23   AMB POC URINALYSIS DIP STICK AUTO W/O MICRO   Result Value Ref Range    Color (UA POC)      Clarity (UA POC)      Glucose, Urine, POC Negative Negative mg/dL    Bilirubin, Urine, POC Negative Negative    KETONES, Urine, POC Trace Negative mg/dL    Specific Gravity, Urine, POC 1.020 1.001 - 1.035    Blood (UA POC) Negative     pH, Urine, POC 7.0 4.6 - 8.0    Protein, Urine, POC Negative Negative mg/dL    Urobilinogen, POC 0.2 mg/dL <1.6 mg/dL    Nitrite, Urine, POC Negative Negative    Leukocyte Esterase, Urine, POC Negative Negative         UA - Micro  WBC - 0  RBC - 0  Bacteria - 0  Epith - 0    Assessment/Plan    ICD-10-CM    1. Prostate cancer (HCC)  C61 AMB POC URINALYSIS DIP STICK AUTO W/O MICRO     PSA, ultrasensitive        RTO in 3 mo with PSA prior.  Pt to research pelvic flr PT at [facility].      Aleene Swanner P Donis Pinder, DO

## 2023-08-12 NOTE — Discharge Instructions (Signed)
 [facility]  [address]  [place] [address]  Phone: [phone]  Fax: [phone]    OUTPATIENT PHYSICAL THERAPY  Discontinuation Summary 08/12/2023  Episode  Appt Desk         Javier James has been seen in physical therapy for 1  visits from 10/19/22 to 10/19/22, with 0 cancellations and 0 no shows. [name] therapy has come to an end at this time due to: Patient did not return for/schedule additional treatment    Physical Therapy Goals:  Not assessed at time of d/c    Magnolia Schroeder, PT

## 2023-09-01 ENCOUNTER — Encounter

## 2023-09-02 LAB — PSA, ULTRASENSITIVE: PSA, Ultrasensitive: 0.085 ng/mL (ref 0.000–4.000)

## 2023-09-06 ENCOUNTER — Ambulatory Visit: Admit: 2023-09-06 | Discharge: 2023-09-06 | Payer: 59 | Attending: Urology | Primary: Family Medicine

## 2023-09-06 DIAGNOSIS — C61 Malignant neoplasm of prostate: Principal | ICD-10-CM

## 2023-09-06 LAB — AMB POC URINALYSIS DIP STICK AUTO W/O MICRO
Bilirubin, Urine, POC: NEGATIVE
Blood (UA POC): NEGATIVE
Glucose, Urine, POC: NEGATIVE mg/dL
KETONES, Urine, POC: NEGATIVE mg/dL
Leukocyte Esterase, Urine, POC: NEGATIVE
Nitrite, Urine, POC: NEGATIVE
Protein, Urine, POC: NEGATIVE mg/dL
Specific Gravity, Urine, POC: 1.03 (ref 1.001–1.035)
Urobilinogen, POC: 0.2 mg/dL (ref <1.1)
pH, Urine, POC: 6 (ref 4.6–8.0)

## 2023-09-06 MED ORDER — TADALAFIL 20 MG PO TABS
20 | ORAL_TABLET | Freq: Every day | ORAL | 5 refills | 30.00000 days | Status: AC | PRN
Start: 2023-09-06 — End: ?

## 2023-09-06 NOTE — Progress Notes (Signed)
 [facility]  [address]  [place], [place] [address]  [phone]    Javier James  DOB: Jul 21, 1965     HPI   58 y.o., male returns in follow up for CaP. S/P RALP on 11/16/22. Path showed Gleason 4+3, T2N0 with a R inf positive margin. He has done well post op. Taking Cialis  prior.  Reports improving erections on Cialis  rehab.  Cont to report 1ppd [facility]I.  Unable to reach [facility] PT.  PSA was 0.016 on 02/15/23; 0.05 on 05/18/23 and is now 0.085 on 09/01/23.  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       Past Medical History:   Diagnosis Date    CAD (coronary artery disease)     10% LAD    Elevated PSA     Hypertension     Prostate cancer (HCC)     PTSD (post-traumatic stress disorder)     Sleep apnea     uses cpap hs    Type 2 diabetes mellitus (HCC)     oral reliant; AVG BS 80-100; denies hypo; last A1c     Past Surgical History:   Procedure Laterality Date    APPENDECTOMY      BLADDER SURGERY  2016    Lesion removed    CARDIAC PROCEDURE N/A 02/12/2022    Left heart cath / coronary angiography performed by Sterling Rome Ned, MD at [facility] CARDIAC CATH LAB    KNEE SURGERY Right     PROSTATE BIOPSY N/A 09/08/2022    MRI FUSION PROSTATE BIOPSY performed by Cherylann Mungo, MD at [facility] MAIN OR    PROSTATECTOMY Bilateral 11/16/2022    PROSTATECTOMY LAPAROSCOPIC ROBOTIC,POSSIBLE BILATERAL LYMPH NODE DISSECTION TO THE PROSTATE performed by Debbora Jayson SQUIBB, DO at [facility] MAIN OR    VASECTOMY  1994     Current Outpatient Medications   Medication Sig Dispense Refill    tadalafil  (CIALIS ) 20 MG tablet Take 1 tablet by mouth daily as needed for Erectile Dysfunction 20 tablet 5    lamoTRIgine  (LAMICTAL ) 25 MG tablet Take 3 tablets by mouth at bedtime      sertraline  (ZOLOFT ) 50 MG tablet Take 3 tablets by mouth daily      lurasidone  (LATUDA ) 60 MG TABS tablet Take 1 tablet by mouth at bedtime      traZODone  (DESYREL ) 50 MG tablet Take 3 tablets by mouth nightly      hydrOXYzine  HCl (ATARAX ) 50 MG tablet Take 1 tablet by mouth 3 times daily as  needed for Itching      carvedilol  (COREG ) 3.125 MG tablet Take 1 tablet by mouth 2 times daily (with meals)      lithium  300 MG capsule Take 2 capsules by mouth at bedtime      busPIRone  (BUSPAR ) 10 MG tablet Take 1 tablet by mouth 3 times daily      clonazePAM  (KLONOPIN ) 0.5 MG tablet Take 1 tablet by mouth 2 times daily as needed.      ciprofloxacin  (CIPRO ) 500 MG tablet Take 1 tablet by mouth 2 times daily for 7 days 14 tablet 0    HYDROcodone -acetaminophen  (NORCO) 5-325 MG per tablet Take 1 tablet by mouth every 4 hours as needed for Pain for up to 3 days. Max Daily Amount: 6 tablets 12 tablet 0    docusate (COLACE, DULCOLAX) 100 MG CAPS Take 100 mg by mouth 2 times daily 60 capsule 0    metFORMIN  (GLUCOPHAGE ) 500 MG tablet Take 1 tablet by mouth 2 times daily (with  meals)      tamsulosin  (FLOMAX ) 0.4 MG capsule Take 1 capsule by mouth nightly 30 capsule 5     No current facility-administered medications for this visit.     Allergies   Allergen Reactions    Penicillins Anaphylaxis     Other Reaction(s): Pharyngeal spasm, Unknown    Lisinopril      Other Reaction(s): Cough    Topiramate Other (See Comments)     When I take it, I'm up for 3 or 4 days     Social History     Socioeconomic History    Marital status: Married     Spouse name: Not on file    Number of children: Not on file    Years of education: Not on file    Highest education level: Not on file   Occupational History    Not on file   Tobacco Use    Smoking status: Former     Current packs/day: 0.00     Average packs/day: 1 pack/day for 14.0 years (14.0 ttl pk-yrs)     Types: Cigarettes     Start date: 03/09/1994     Quit date: 2010     Years since quitting: 15.5     Passive exposure: Past    Smokeless tobacco: Never   Vaping Use    Vaping status: Never Used   Substance and Sexual Activity    Alcohol use: Not Currently    Drug use: Never    Sexual activity: Yes     Partners: Female   Other Topics Concern    Not on file   Social History Narrative     Not on file     Social Drivers of Health     Financial Resource Strain: Low Risk  (07/18/2023)    Received from [facility]    Overall Financial Resource Strain (CARDIA)     Difficulty of Paying Living Expenses: Not hard at all   Food Insecurity: No Food Insecurity (07/18/2023)    Received from [facility]    Hunger Vital Sign     Worried About Running Out of Food in the Last Year: Never true     Ran Out of Food in the Last Year: Never true   Transportation Needs: No Transportation Needs (07/18/2023)    Received from [employer] [facility]    PRAPARE - Transportation     Lack of Transportation (Medical): No     Lack of Transportation (Non-Medical): No   Physical Activity: Sufficiently Active (07/18/2023)    Received from [facility]    Exercise Vital Sign     Days of Exercise per Week: 4 days     Minutes of Exercise per Session: 120 min   Stress: No Stress Concern Present (07/18/2023)    Received from [facility]    [facility] of Occupational Health - Occupational Stress Questionnaire     Feeling of Stress : Only a little   Social Connections: Socially Integrated (07/18/2023)    Received from [facility]    Social Connection and Isolation Panel [NHANES]     Frequency of Communication with Friends and Family: Twice a week     Frequency of Social Gatherings with Friends and Family: More than three times a week     Attends Religious Services: More than 4 times per year     Active Member of [employer] or Organizations: Yes  Attends Banker Meetings: 1 to 4 times per year     Marital Status: Married   Catering manager Violence: Not At Risk (07/18/2023)    Received from [facility]    Humiliation, Afraid, Rape, and Kick questionnaire     Fear of Current or Ex-Partner: No     Emotionally Abused: No     Physically Abused: No     Sexually  Abused: No   Housing Stability: Low Risk  (11/16/2022)    Housing Stability Vital Sign     Unable to Pay for Housing in the Last Year: No     Number of Times Moved in the Last Year: 0     Homeless in the Last Year: No     Family History   Problem Relation Age of Onset    Heart Disease Father        Review of Systems  All systems reviewed and are negative at this time.    Physical Exam  There were no vitals taken for this visit.  General appearance - alert, well appearing, and in no distress  Mental status - alert, oriented to person, place, and time  Eyes - extraocular eye movements intact, sclera anicteric  Abdomen - soft, nontender, nondistended, no masses or organomegaly  Neurological -  normal speech, no focal findings or movement disorder noted  Skin - normal coloration and turgor      Urinalysis  UA - Dipstick  Results for orders placed or performed in visit on 09/06/23   AMB POC URINALYSIS DIP STICK AUTO W/O MICRO   Result Value Ref Range    Color (UA POC)      Clarity (UA POC)      Glucose, Urine, POC Negative Negative mg/dL    Bilirubin, Urine, POC Negative Negative    KETONES, Urine, POC Negative Negative mg/dL    Specific Gravity, Urine, POC 1.030 1.001 - 1.035    Blood (UA POC) Negative     pH, Urine, POC 6.0 4.6 - 8.0    Protein, Urine, POC Negative Negative mg/dL    Urobilinogen, POC 0.2 mg/dL <8.8 mg/dL    Nitrite, Urine, POC Negative Negative    Leukocyte Esterase, Urine, POC Negative Negative         UA - Micro  WBC - 0  RBC - 0  Bacteria - 0  Epith - 0    Assessment/Plan    ICD-10-CM    1. Prostate cancer (HCC)  C61 AMB POC URINALYSIS DIP STICK AUTO W/O MICRO     PSA, ultrasensitive      2. Erectile dysfunction due to arterial insufficiency  N52.01 tadalafil  (CIALIS ) 20 MG tablet        Cont Cialis .  RTO in 4 mo with PSA prior.  Will cont to follow trend.      Javier Tofte P Vaanya Shambaugh, DO

## 2023-10-25 MED ORDER — TADALAFIL 20 MG PO TABS
20 | ORAL_TABLET | ORAL | 5 refills | 42.50000 days | Status: AC
Start: 2023-10-25 — End: ?
